# Patient Record
Sex: Female | Born: 1985 | Race: Black or African American | Hispanic: No | Marital: Married | Smoking: Never smoker
Health system: Southern US, Community
[De-identification: ages and names within clinical notes are randomized; demographics above are authoritative.]

## PROBLEM LIST (undated history)

## (undated) DIAGNOSIS — K589 Irritable bowel syndrome without diarrhea: Secondary | ICD-10-CM

## (undated) DIAGNOSIS — F419 Anxiety disorder, unspecified: Secondary | ICD-10-CM

## (undated) DIAGNOSIS — F410 Panic disorder [episodic paroxysmal anxiety] without agoraphobia: Secondary | ICD-10-CM

---
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
## 2007-04-13 ENCOUNTER — Emergency Department: Payer: Self-pay | Admitting: Emergency Medicine

## 2007-06-01 ENCOUNTER — Emergency Department: Payer: Self-pay | Admitting: Emergency Medicine

## 2008-06-04 ENCOUNTER — Emergency Department: Payer: Self-pay | Admitting: Emergency Medicine

## 2009-03-31 ENCOUNTER — Emergency Department: Payer: Self-pay | Admitting: Internal Medicine

## 2009-05-14 ENCOUNTER — Emergency Department: Payer: Self-pay | Admitting: Emergency Medicine

## 2009-07-15 ENCOUNTER — Inpatient Hospital Stay: Payer: Self-pay | Admitting: Internal Medicine

## 2010-01-03 ENCOUNTER — Emergency Department: Payer: Self-pay | Admitting: Emergency Medicine

## 2010-02-13 ENCOUNTER — Emergency Department: Payer: Self-pay | Admitting: Emergency Medicine

## 2010-06-19 ENCOUNTER — Emergency Department: Payer: Self-pay | Admitting: Emergency Medicine

## 2010-09-11 ENCOUNTER — Emergency Department: Payer: Self-pay | Admitting: Emergency Medicine

## 2011-04-22 ENCOUNTER — Emergency Department: Payer: Self-pay | Admitting: *Deleted

## 2011-08-25 ENCOUNTER — Emergency Department: Payer: Self-pay | Admitting: Emergency Medicine

## 2011-08-28 ENCOUNTER — Ambulatory Visit: Payer: Self-pay | Admitting: Family Medicine

## 2011-10-01 ENCOUNTER — Observation Stay: Payer: Self-pay | Admitting: Obstetrics and Gynecology

## 2011-10-01 LAB — URINALYSIS, COMPLETE
Bilirubin,UR: NEGATIVE
Blood: NEGATIVE
Glucose,UR: NEGATIVE mg/dL (ref 0–75)
Ph: 7 (ref 4.5–8.0)
Protein: NEGATIVE
Specific Gravity: 1.004 (ref 1.003–1.030)
WBC UR: 8 /HPF (ref 0–5)

## 2011-10-03 LAB — URINE CULTURE

## 2013-11-23 ENCOUNTER — Emergency Department: Payer: Self-pay | Admitting: Student

## 2013-11-23 LAB — URINALYSIS, COMPLETE
Bacteria: NONE SEEN
Bilirubin,UR: NEGATIVE
Blood: NEGATIVE
GLUCOSE, UR: NEGATIVE mg/dL (ref 0–75)
KETONE: NEGATIVE
NITRITE: NEGATIVE
Ph: 5 (ref 4.5–8.0)
Protein: NEGATIVE
RBC, UR: NONE SEEN /HPF (ref 0–5)
SPECIFIC GRAVITY: 1.024 (ref 1.003–1.030)
WBC UR: 3 /HPF (ref 0–5)

## 2013-11-23 LAB — CBC
HCT: 38.3 % (ref 35.0–47.0)
HGB: 12.3 g/dL (ref 12.0–16.0)
MCH: 28 pg (ref 26.0–34.0)
MCHC: 32.1 g/dL (ref 32.0–36.0)
MCV: 87 fL (ref 80–100)
Platelet: 291 10*3/uL (ref 150–440)
RBC: 4.4 10*6/uL (ref 3.80–5.20)
RDW: 13.1 % (ref 11.5–14.5)
WBC: 6.9 10*3/uL (ref 3.6–11.0)

## 2013-11-23 LAB — COMPREHENSIVE METABOLIC PANEL
ANION GAP: 8 (ref 7–16)
Albumin: 3.5 g/dL (ref 3.4–5.0)
Alkaline Phosphatase: 81 U/L
BILIRUBIN TOTAL: 0.4 mg/dL (ref 0.2–1.0)
BUN: 8 mg/dL (ref 7–18)
Calcium, Total: 8.4 mg/dL — ABNORMAL LOW (ref 8.5–10.1)
Chloride: 110 mmol/L — ABNORMAL HIGH (ref 98–107)
Co2: 25 mmol/L (ref 21–32)
Creatinine: 1.03 mg/dL (ref 0.60–1.30)
EGFR (Non-African Amer.): >60
Glucose: 87 mg/dL (ref 65–99)
Osmolality: 283 (ref 275–301)
Potassium: 3.9 mmol/L (ref 3.5–5.1)
SGOT(AST): 20 U/L (ref 15–37)
SGPT (ALT): 15 U/L
Sodium: 143 mmol/L (ref 136–145)
Total Protein: 7.7 g/dL (ref 6.4–8.2)

## 2013-11-23 LAB — LIPASE, BLOOD: LIPASE: 204 U/L (ref 73–393)

## 2015-07-24 ENCOUNTER — Emergency Department: Payer: Managed Care, Other (non HMO)

## 2015-07-24 ENCOUNTER — Emergency Department
Admission: EM | Admit: 2015-07-24 | Discharge: 2015-07-24 | Disposition: A | Discharge status: Home / Self Care | Payer: Managed Care, Other (non HMO) | Attending: Emergency Medicine | Admitting: Emergency Medicine

## 2015-07-24 ENCOUNTER — Encounter: Payer: Self-pay | Admitting: Emergency Medicine

## 2015-07-24 DIAGNOSIS — R0602 Shortness of breath: Secondary | ICD-10-CM | POA: Diagnosis present

## 2015-07-24 DIAGNOSIS — R0600 Dyspnea, unspecified: Secondary | ICD-10-CM

## 2015-07-24 DIAGNOSIS — F419 Anxiety disorder, unspecified: Secondary | ICD-10-CM | POA: Insufficient documentation

## 2015-07-24 HISTORY — DX: Panic disorder (episodic paroxysmal anxiety): F41.0

## 2015-07-24 HISTORY — DX: Anxiety disorder, unspecified: F41.9

## 2015-07-24 HISTORY — DX: Irritable bowel syndrome, unspecified: K58.9

## 2015-07-24 HISTORY — DX: Irritable bowel syndrome without diarrhea: K58.9

## 2015-07-24 LAB — BASIC METABOLIC PANEL
ANION GAP: 6 (ref 5–15)
BUN: 5 mg/dL — ABNORMAL LOW (ref 6–20)
CALCIUM: 8.9 mg/dL (ref 8.9–10.3)
CO2: 23 mmol/L (ref 22–32)
CREATININE: 0.82 mg/dL (ref 0.44–1.00)
Chloride: 109 mmol/L (ref 101–111)
Glucose, Bld: 107 mg/dL — ABNORMAL HIGH (ref 65–99)
Potassium: 3 mmol/L — ABNORMAL LOW (ref 3.5–5.1)
SODIUM: 138 mmol/L (ref 135–145)

## 2015-07-24 LAB — CBC WITH DIFFERENTIAL/PLATELET
BASOS ABS: 0 10*3/uL (ref 0–0.1)
BASOS PCT: 0 %
EOS ABS: 0.1 10*3/uL (ref 0–0.7)
Eosinophils Relative: 1 %
HCT: 41 % (ref 35.0–47.0)
Hemoglobin: 13.6 g/dL (ref 12.0–16.0)
Lymphocytes Relative: 41 %
Lymphs Abs: 3.9 10*3/uL — ABNORMAL HIGH (ref 1.0–3.6)
MCH: 28.3 pg (ref 26.0–34.0)
MCHC: 33.1 g/dL (ref 32.0–36.0)
MCV: 85.5 fL (ref 80.0–100.0)
MONO ABS: 0.8 10*3/uL (ref 0.2–0.9)
MONOS PCT: 8 %
NEUTROS ABS: 4.7 10*3/uL (ref 1.4–6.5)
Neutrophils Relative %: 50 %
PLATELETS: 296 10*3/uL (ref 150–440)
RBC: 4.8 MIL/uL (ref 3.80–5.20)
RDW: 13.9 % (ref 11.5–14.5)
WBC: 9.5 10*3/uL (ref 3.6–11.0)

## 2015-07-24 LAB — BRAIN NATRIURETIC PEPTIDE: B NATRIURETIC PEPTIDE 5: 10 pg/mL (ref 0.0–100.0)

## 2015-07-24 LAB — HCG, QUANTITATIVE, PREGNANCY

## 2015-07-24 LAB — TROPONIN I

## 2015-07-24 MED ORDER — IBUPROFEN 800 MG PO TABS: 800.0000 mg | ORAL_TABLET | Freq: Once | ORAL | Status: DC

## 2015-07-24 MED ORDER — KETOROLAC TROMETHAMINE 30 MG/ML IJ SOLN
30.0000 mg | Freq: Once | INTRAMUSCULAR | Status: AC
  Administered 2015-07-24: 30 mg via INTRAVENOUS

## 2015-07-24 MED ORDER — LORAZEPAM 1 MG PO TABS: 1.0000 mg | ORAL_TABLET | Freq: Two times a day (BID) | ORAL | Status: AC

## 2015-07-24 MED ORDER — KETOROLAC TROMETHAMINE 30 MG/ML IJ SOLN
INTRAMUSCULAR | Status: AC
  Administered 2015-07-24: 30 mg via INTRAVENOUS
  Filled 2015-07-24: qty 1

## 2015-07-24 NOTE — ED Notes (Signed)
Patient presents to the ED with intermittent episodes of palpitations, shortness of breath, extreme fatigue and anxiety since Friday.  Patient states, "I've been feeling really weird."  Patient's breath sounds are clear but slightly diminished.  Patient denies any history of asthma.  Patient breathing slightly fast in triage 24breats/min.  Patient denies fever, vomiting, or diarrhea.  Patient reports night sweats.

## 2015-07-24 NOTE — ED Notes (Signed)
Pt discharged to home.  Friend driving.  Discharge instructions reviewed.  Verbalized understanding.  No questions or concerns at this time.  Teach back verified.  Pt in NAD.  No items left in ED.   

## 2015-07-24 NOTE — ED Provider Notes (Signed)
[facility] Emergency Department Provider Note     Time seen: ----------------------------------------- 4:10 PM on 07/24/2015 -----------------------------------------    I have reviewed the triage vital signs and the nursing notes.   HISTORY  Chief Complaint Palpitations; Shortness of Breath; and Weakness    HPI Carrie Hebert is a 30 y.o. female who presents to ER for palpitations, shortness of breath and fatigue with anxiety since Friday. Patient states she's not been feeling herself, she denies any history of asthma, states she was seen by her primary care doctor for the shortness of breath and was told she may have a sinus infection was sent here for further evaluation. She denies fever, vomiting or diarrhea. She is on dip oh for birth control, has not had a menstrual cycle in some time. She does report some night sweats which are unusual for her   Past Medical History  Diagnosis Date  . IBS (irritable bowel syndrome)   . Anxiety   . Panic attacks     There are no active problems to display for this patient.   History reviewed. No pertinent past surgical history.  Allergies Percocet and Vicodin  Social History Social History  Substance Use Topics  . Smoking status: Never Smoker   . Smokeless tobacco: None  . Alcohol Use: Yes     Comment: couple of times a week    Review of Systems Constitutional: Negative for fever. Eyes: Negative for visual changes. ENT: Negative for sore throat. Cardiovascular: Negative for chest pain.Positive for palpitations Respiratory: Positive for shortness of breath Gastrointestinal: Negative for abdominal pain, vomiting and diarrhea. Genitourinary: Negative for dysuria. Musculoskeletal: Negative for back pain. Skin: Negative for rash. Neurological: Negative for headaches, positive for weakness  10-point ROS otherwise negative.  ____________________________________________   PHYSICAL  EXAM:  VITAL SIGNS: ED Triage Vitals  Enc Vitals Group     BP 07/24/15 1601 134/78 mmHg     Pulse Rate 07/24/15 1601 106     Resp 07/24/15 1601 22     Temp 07/24/15 1601 98.3 F (36.8 C)     Temp Source 07/24/15 1601 Oral     SpO2 07/24/15 1601 98 %     Weight 07/24/15 1601 200 lb (90.719 kg)     Height 07/24/15 1601  (1.702 m)     Head Cir --      Peak Flow --      Pain Score 07/24/15 1601 8     Pain Loc --      Pain Edu? --      Excl. in GC? --    Constitutional: Alert and oriented. Well appearing and in no distress. Eyes: Conjunctivae are normal. PERRL. Normal extraocular movements. ENT   Head: Normocephalic and atraumatic.   Nose: No congestion/rhinnorhea.   Mouth/Throat: Mucous membranes are moist.   Neck: No stridor. Cardiovascular: Normal rate, regular rhythm. No murmurs, rubs, or gallops. Respiratory: Normal respiratory effort without tachypnea nor retractions. Breath sounds are clear and equal bilaterally. No wheezes/rales/rhonchi. Gastrointestinal: Soft and nontender. Normal bowel sounds Musculoskeletal: Nontender with normal range of motion in all extremities. No lower extremity tenderness nor edema. Neurologic:  Normal speech and language. No gross focal neurologic deficits are appreciated.  Skin:  Skin is warm, dry and intact. No rash noted. Psychiatric: Mood and affect are normal. Speech and behavior are normal.  ____________________________________________  EKG: Interpreted by me. Sinus tachycardia with a rate of 101 bpm, normal PR interval, normal QRS, normal QT interval. Normal  axis.  ____________________________________________  ED COURSE:  Pertinent labs & imaging results that were available during my care of the patient were reviewed by me and considered in my medical decision making (see chart for details). Patient is in no acute distress, will check basic labs, pregnancy test and  reevaluate. ____________________________________________    LABS (pertinent positives/negatives)  Labs Reviewed  CBC WITH DIFFERENTIAL/PLATELET - Abnormal; Notable for the following:    Lymphs Abs 3.9 (*)    All other components within normal limits  BASIC METABOLIC PANEL - Abnormal; Notable for the following:    Potassium 3.0 (*)    Glucose, Bld 107 (*)    BUN 5 (*)    All other components within normal limits  BRAIN NATRIURETIC PEPTIDE  TROPONIN I  HCG, QUANTITATIVE, PREGNANCY    RADIOLOGY Images were viewed by me  Chest x-ray  IMPRESSION: No active cardiopulmonary disease. ____________________________________________  FINAL ASSESSMENT AND PLAN  Dyspnea, Anxiety  Plan: Patient with labs and imaging as dictated above. Patient most likely is presenting with dyspnea that is symptom from anxiety. I will start her on Ativan to take as needed. She is advised to restart her Zoloft at home as prescribed.   Emily FilbertWilliams, Jonathan E, MD   Emily FilbertJonathan E Williams, MD 07/24/15 [phone]

## 2015-07-24 NOTE — Discharge Instructions (Signed)
Panic Attacks °Panic attacks are sudden, short-lived surges of severe anxiety, fear, or discomfort. They may occur for no reason when you are relaxed, when you are anxious, or when you are sleeping. Panic attacks may occur for a number of reasons:  °· Healthy people occasionally have panic attacks in extreme, life-threatening situations, such as war or natural disasters. Normal anxiety is a protective mechanism of the body that helps us react to danger (fight or flight response). °· Panic attacks are often seen with anxiety disorders, such as panic disorder, social anxiety disorder, generalized anxiety disorder, and phobias. Anxiety disorders cause excessive or uncontrollable anxiety. They may interfere with your relationships or other life activities. °· Panic attacks are sometimes seen with other mental illnesses, such as depression and posttraumatic stress disorder. °· Certain medical conditions, prescription medicines, and drugs of abuse can cause panic attacks. °SYMPTOMS  °Panic attacks start suddenly, peak within 20 minutes, and are accompanied by four or more of the following symptoms: °· Pounding heart or fast heart rate (palpitations). °· Sweating. °· Trembling or shaking. °· Shortness of breath or feeling smothered. °· Feeling choked. °· Chest pain or discomfort. °· Nausea or strange feeling in your stomach. °· Dizziness, light-headedness, or feeling like you will faint. °· Chills or hot flushes. °· Numbness or tingling in your lips or hands and feet. °· Feeling that things are not real or feeling that you are not yourself. °· Fear of losing control or going crazy. °· Fear of dying. °Some of these symptoms can mimic serious medical conditions. For example, you may think you are having a heart attack. Although panic attacks can be very scary, they are not life threatening. °DIAGNOSIS  °Panic attacks are diagnosed through an assessment by your health care provider. Your health care provider will ask  questions about your symptoms, such as where and when they occurred. Your health care provider will also ask about your medical history and use of alcohol and drugs, including prescription medicines. Your health care provider may order blood tests or other studies to rule out a serious medical condition. Your health care provider may refer you to a mental health professional for further evaluation. °TREATMENT  °· Most healthy people who have one or two panic attacks in an extreme, life-threatening situation will not require treatment. °· The treatment for panic attacks associated with anxiety disorders or other mental illness typically involves counseling with a mental health professional, medicine, or a combination of both. Your health care provider will help determine what treatment is best for you. °· Panic attacks due to physical illness usually go away with treatment of the illness. If prescription medicine is causing panic attacks, talk with your health care provider about stopping the medicine, decreasing the dose, or substituting another medicine. °· Panic attacks due to alcohol or drug abuse go away with abstinence. Some adults need professional help in order to stop drinking or using drugs. °HOME CARE INSTRUCTIONS  °· Take all medicines as directed by your health care provider.   °· Schedule and attend follow-up visits as directed by your health care provider. It is important to keep all your appointments. °SEEK MEDICAL CARE IF: °· You are not able to take your medicines as prescribed. °· Your symptoms do not improve or get worse. °SEEK IMMEDIATE MEDICAL CARE IF:  °· You experience panic attack symptoms that are different than your usual symptoms. °· You have serious thoughts about hurting yourself or others. °· You are taking medicine for panic attacks and   have a serious side effect. °MAKE SURE YOU: °· Understand these instructions. °· Will watch your condition. °· Will get help right away if you are not  doing well or get worse. °  °This information is not intended to replace advice given to you by your health care provider. Make sure you discuss any questions you have with your health care provider. °  °Document Released: 03/31/2005 Document Revised: 04/05/2013 Document Reviewed: 11/12/2012 °Elsevier Interactive Patient Education ©2016 [employer] ° °Shortness of Breath °Shortness of breath means you have trouble breathing. It could also mean that you have a medical problem. You should get immediate medical care for shortness of breath. °CAUSES  °· Not enough oxygen in the air such as with high altitudes or a smoke-filled room. °· Certain lung diseases, infections, or problems. °· Heart disease or conditions, such as angina or heart failure. °· Low red blood cells (anemia). °· Poor physical fitness, which can cause shortness of breath when you exercise. °· Chest or back injuries or stiffness. °· Being overweight. °· Smoking. °· Anxiety, which can make you feel like you are not getting enough air. °DIAGNOSIS  °Serious medical problems can often be found during your physical exam. Tests may also be done to determine why you are having shortness of breath. Tests may include: °· Chest X-rays. °· Lung function tests. °· Blood tests. °· An electrocardiogram (ECG). °· An ambulatory electrocardiogram. An ambulatory ECG records your heartbeat patterns over a 24-hour period. °· Exercise testing. °· A transthoracic echocardiogram (TTE). During echocardiography, sound waves are used to evaluate how blood flows through your heart. °· A transesophageal echocardiogram (TEE). °· Imaging scans. °Your health care provider may not be able to find a cause for your shortness of breath after your exam. In this case, it is important to have a follow-up exam with your health care provider as directed.  °TREATMENT  °Treatment for shortness of breath depends on the cause of your symptoms and can vary greatly. °HOME CARE INSTRUCTIONS   °· Do not smoke. Smoking is a common cause of shortness of breath. If you smoke, ask for help to quit. °· Avoid being around chemicals or things that may bother your breathing, such as paint fumes and dust. °· Rest as needed. Slowly resume your usual activities. °· If medicines were prescribed, take them as directed for the full length of time directed. This includes oxygen and any inhaled medicines. °· Keep all follow-up appointments as directed by your health care provider. °SEEK MEDICAL CARE IF:  °· Your condition does not improve in the time expected. °· You have a hard time doing your normal activities even with rest. °· You have any new symptoms. °SEEK IMMEDIATE MEDICAL CARE IF:  °· Your shortness of breath gets worse. °· You feel light-headed, faint, or develop a cough not controlled with medicines. °· You start coughing up blood. °· You have pain with breathing. °· You have chest pain or pain in your arms, shoulders, or abdomen. °· You have a fever. °· You are unable to walk up stairs or exercise the way you normally do. °MAKE SURE YOU: °· Understand these instructions. °· Will watch your condition. °· Will get help right away if you are not doing well or get worse. °  °This information is not intended to replace advice given to you by your health care provider. Make sure you discuss any questions you have with your health care provider. °  °Document Released: 12/24/2000 Document Revised: 04/05/2013   Document Reviewed: 06/16/2011 °Elsevier Interactive Patient Education ©2016 [employer] Inc. ° °

## 2018-01-20 ENCOUNTER — Encounter (HOSPITAL_COMMUNITY): Payer: Self-pay | Admitting: Emergency Medicine

## 2018-01-20 ENCOUNTER — Emergency Department (HOSPITAL_COMMUNITY)
Admission: EM | Admit: 2018-01-20 | Discharge: 2018-01-20 | Disposition: A | Discharge status: Home / Self Care | Payer: 59 | Attending: Emergency Medicine | Admitting: Emergency Medicine

## 2018-01-20 ENCOUNTER — Other Ambulatory Visit: Payer: Self-pay

## 2018-01-20 ENCOUNTER — Emergency Department (HOSPITAL_COMMUNITY): Payer: 59

## 2018-01-20 DIAGNOSIS — R002 Palpitations: Secondary | ICD-10-CM | POA: Insufficient documentation

## 2018-01-20 DIAGNOSIS — R0789 Other chest pain: Secondary | ICD-10-CM

## 2018-01-20 LAB — I-STAT CHEM 8, ED
BUN: 5 mg/dL — ABNORMAL LOW (ref 6–20)
Calcium, Ion: 1.14 mmol/L — ABNORMAL LOW (ref 1.15–1.40)
Chloride: 106 mmol/L (ref 98–111)
Creatinine, Ser: 0.8 mg/dL (ref 0.44–1.00)
Glucose, Bld: 181 mg/dL — ABNORMAL HIGH (ref 70–99)
HEMATOCRIT: 42 % (ref 36.0–46.0)
HEMOGLOBIN: 14.3 g/dL (ref 12.0–15.0)
POTASSIUM: 3.4 mmol/L — AB (ref 3.5–5.1)
SODIUM: 138 mmol/L (ref 135–145)
TCO2: 22 mmol/L (ref 22–32)

## 2018-01-20 LAB — I-STAT BETA HCG BLOOD, ED (MC, WL, AP ONLY)

## 2018-01-20 MED ORDER — ONDANSETRON 4 MG PO TBDP
4.0000 mg | ORAL_TABLET | Freq: Once | ORAL | Status: AC
  Administered 2018-01-20: 4 mg via ORAL
  Filled 2018-01-20: qty 1

## 2018-01-20 MED ORDER — GI COCKTAIL ~~LOC~~
30.0000 mL | Freq: Once | ORAL | Status: AC
  Administered 2018-01-20: 30 mL via ORAL
  Filled 2018-01-20: qty 30

## 2018-01-20 NOTE — ED Notes (Signed)
ED Provider at bedside. 

## 2018-01-20 NOTE — ED Triage Notes (Signed)
Per EMS, pt coming from work with complaints of a HR in the 170's. Pt states that around 1400 she started to get real diaphoretic and had a near syncopal episode. Pt stated that her apple watch read her HR in the 170's. Pt stated this has been going on since July and she just finished wearing a Holter monitor. Pt denies CP or SOB.

## 2018-01-20 NOTE — ED Provider Notes (Signed)
[facility] EMERGENCY DEPARTMENT Provider Note   CSN: [id number] Arrival date & time: 01/20/18  1501     History   Chief Complaint Chief Complaint  Patient presents with  . Tachycardia    HPI Carrie Hebert is a 32 y.o. female.  32 yo F with a cc of palpitations.  170's on fit watch.  Hands tingling, felt "numb".  Lasted for about 1/2 hour.  HR improved with EMS.  Episodes similar in past.  Holter in past.  Denies other prior cardiac history.  Denies fevers, cough, so.  Had some mild midsternal chest pain. No fam hx of sudden cardiac death.   The patient describes that she had some tingling around her mouth and then felt tingling down both of her arms and then felt that her hands went into claws and then she collapsed onto the ground.  She denies any injury from the fall.  Scribes that her chest pain is sharp worse with wearing a bra, moving or twisting.  Worse with deep breathing.  She denies hemoptysis denies cough or fever denies recent surgery or hospitalization.  Denies estrogen use.  Denies history of PE or DVT.  Denies history of cancer.  The history is provided by the patient.  Illness  This is a recurrent problem. The current episode started less than 1 hour ago. The problem occurs constantly. The problem has been resolved. Pertinent negatives include no chest pain, no abdominal pain, no headaches and no shortness of breath. Nothing aggravates the symptoms. Nothing relieves the symptoms. She has tried nothing for the symptoms. The treatment provided no relief.    Past Medical History:  Diagnosis Date  . Anxiety   . IBS (irritable bowel syndrome)   . Panic attacks     There are no active problems to display for this patient.   History reviewed. No pertinent surgical history.   OB History   None      Home Medications    Prior to Admission medications   Medication Sig Start Date End Date Taking? Authorizing Provider  ibuprofen  (ADVIL,MOTRIN) 800 MG tablet Take 800 mg by mouth as needed for mild pain.    Yes [provider]    Family History History reviewed. No pertinent family history.  Social History Social History   Tobacco Use  . Smoking status: Never Smoker  Substance Use Topics  . Alcohol use: Yes    Comment: couple of times a week  . Drug use: Not on file     Allergies   Nickel; Percocet [oxycodone-acetaminophen]; and Vicodin [hydrocodone-acetaminophen]   Review of Systems Review of Systems  Constitutional: Negative for chills and fever.  HENT: Negative for congestion and rhinorrhea.   Eyes: Negative for redness and visual disturbance.  Respiratory: Negative for shortness of breath and wheezing.   Cardiovascular: Negative for chest pain and palpitations.  Gastrointestinal: Negative for abdominal pain, nausea and vomiting.  Genitourinary: Negative for dysuria and urgency.  Musculoskeletal: Negative for arthralgias and myalgias.  Skin: Negative for pallor and wound.  Neurological: Negative for dizziness and headaches.     Physical Exam Updated Vital Signs BP 113/69   Pulse 82   Resp (!) 22   Ht 5\' 7"  (1.702 m)   Wt 88 kg   SpO2 98%   BMI 30.38 kg/m   Physical Exam  Constitutional: She is oriented to person, place, and time. She appears well-developed and well-nourished. No distress.  HENT:  Head: Normocephalic and atraumatic.  Eyes: Pupils are equal, round, and reactive to light. EOM are normal.  Neck: Normal range of motion. Neck supple.  Cardiovascular: Normal rate and regular rhythm. Exam reveals no gallop and no friction rub.  No murmur heard. Pulmonary/Chest: Effort normal. She has no wheezes. She has no rales. She exhibits tenderness ( Palpation of the left sternal border about ribs 3 and 4 reproduce the patient's pain).  Abdominal: Soft. She exhibits no distension. There is no tenderness.  Musculoskeletal: She exhibits no edema or tenderness.  Neurological:  She is alert and oriented to person, place, and time.  Skin: Skin is warm and dry. She is not diaphoretic.  Psychiatric: She has a normal mood and affect. Her behavior is normal.  Nursing note and vitals reviewed.    ED Treatments / Results  Labs (all labs ordered are listed, but only abnormal results are displayed) Labs Reviewed  I-STAT CHEM 8, ED - Abnormal; Notable for the following components:      Result Value   Potassium 3.4 (*)    BUN 5 (*)    Glucose, Bld 181 (*)    Calcium, Ion 1.14 (*)    All other components within normal limits  I-STAT BETA HCG BLOOD, ED ([facility], [facility], [facility] ONLY)    EKG EKG Interpretation  Date/Time:  Wednesday January 20 2018 16:01:15 EDT Ventricular Rate:  95 PR Interval:    QRS Duration: 86 QT Interval:  439 QTC Calculation: 552 R Axis:   70 Text Interpretation:  Sinus rhythm Borderline T wave abnormalities Prolonged QT interval No significant change since last tracing Confirmed by Melene Plan [phone]) on 01/20/2018 4:41:10 PM   Radiology Dg Chest 2 View  Result Date: 01/20/2018 CLINICAL DATA:  Chest pain, shortness of breath. EXAM: CHEST - 2 VIEW COMPARISON:  Radiographs of July 24, 2015. FINDINGS: The heart size and mediastinal contours are within normal limits. Both lungs are clear. No pneumothorax or pleural effusion is noted. The visualized skeletal structures are unremarkable. IMPRESSION: No active cardiopulmonary disease. Electronically Signed   By: Lupita Raider, M.D.   On: 01/20/2018 16:49    Procedures Procedures (including critical care time)  Medications Ordered in ED Medications  ondansetron (ZOFRAN-ODT) disintegrating tablet 4 mg (4 mg Oral Given 01/20/18 1552)  gi cocktail (Maalox,Lidocaine,Donnatal) (30 mLs Oral Given 01/20/18 1552)     Initial Impression / Assessment and Plan / ED Course  I have reviewed the triage vital signs and the nursing notes.  Pertinent labs & imaging results that were available during my care of the  patient were reviewed by me and considered in my medical decision making (see chart for details).     32 yo F with a cc of palpitations.  Now resolved.  Patient had a history of this off and on.  Clinically this sounds like the patient had a panic attack.  She is having chest pain and this been going on for the past week or so.  Reproduced on palpation.  Most likely this is musculoskeletal.  Feel this is unlikely to be a PE and do not feel that further work-up is needed.  Unlikely as well to be ACS.  Will obtain an EKG and chest x-ray.  Workup here is unremarkable.  Patient has no significant electrolyte abnormality.  Her potassium is mildly low at 3.4.  She is not pregnant.  Chest x-ray is negative.  She has had no recurrent episodes while in the ED.  We will have her follow-up with her  cardiologist.  12:03 AM:  I have discussed the diagnosis/risks/treatment options with the patient and family and believe the pt to be eligible for discharge home to follow-up with Cards. We also discussed returning to the ED immediately if new or worsening sx occur. We discussed the sx which are most concerning (e.g., recurrent event, sob, chest pain) that necessitate immediate return. Medications administered to the patient during their visit and any new prescriptions provided to the patient are listed below.  Medications given during this visit Medications  ondansetron (ZOFRAN-ODT) disintegrating tablet 4 mg (4 mg Oral Given 01/20/18 1552)  gi cocktail (Maalox,Lidocaine,Donnatal) (30 mLs Oral Given 01/20/18 1552)     The patient appears reasonably screen and/or stabilized for discharge and I doubt any other medical condition or other [facility] requiring further screening, evaluation, or treatment in the ED at this time prior to discharge.    Final Clinical Impressions(s) / ED Diagnoses   Final diagnoses:  Palpitations  Chest wall pain    ED Discharge Orders    None       Melene Plan, DO 01/21/18 0003

## 2018-01-20 NOTE — Discharge Instructions (Signed)
Take 3 over the counter ibuprofen tablets 3 times a day or 2 over-the-counter naproxen tablets twice a day for pain. °Also take tylenol 1000mg(2 extra strength) four times a day.  ° ° °

## 2018-02-19 DIAGNOSIS — F419 Anxiety disorder, unspecified: Secondary | ICD-10-CM | POA: Insufficient documentation

## 2018-02-19 DIAGNOSIS — I4729 Other ventricular tachycardia: Secondary | ICD-10-CM | POA: Insufficient documentation

## 2018-04-27 DIAGNOSIS — G90A Postural orthostatic tachycardia syndrome (POTS): Secondary | ICD-10-CM | POA: Insufficient documentation

## 2019-03-28 ENCOUNTER — Other Ambulatory Visit: Payer: Self-pay

## 2019-03-28 DIAGNOSIS — Z20822 Contact with and (suspected) exposure to covid-19: Secondary | ICD-10-CM

## 2019-03-29 ENCOUNTER — Telehealth: Payer: Self-pay

## 2019-03-29 LAB — NOVEL CORONAVIRUS, NAA: SARS-CoV-2, NAA: NOT DETECTED

## 2020-02-22 DIAGNOSIS — R0789 Other chest pain: Secondary | ICD-10-CM | POA: Insufficient documentation

## 2020-07-25 ENCOUNTER — Other Ambulatory Visit: Payer: Self-pay

## 2020-07-25 ENCOUNTER — Observation Stay
Admission: EM | Admit: 2020-07-25 | Discharge: 2020-07-26 | Disposition: A | Discharge status: Home / Self Care | Payer: 59 | Attending: Emergency Medicine | Admitting: Emergency Medicine

## 2020-07-25 ENCOUNTER — Encounter: Payer: Self-pay | Admitting: Emergency Medicine

## 2020-07-25 ENCOUNTER — Emergency Department: Payer: 59

## 2020-07-25 DIAGNOSIS — M272 Inflammatory conditions of jaws: Secondary | ICD-10-CM | POA: Diagnosis not present

## 2020-07-25 DIAGNOSIS — Z20822 Contact with and (suspected) exposure to covid-19: Secondary | ICD-10-CM | POA: Insufficient documentation

## 2020-07-25 DIAGNOSIS — J029 Acute pharyngitis, unspecified: Secondary | ICD-10-CM | POA: Insufficient documentation

## 2020-07-25 DIAGNOSIS — K122 Cellulitis and abscess of mouth: Secondary | ICD-10-CM | POA: Diagnosis not present

## 2020-07-25 DIAGNOSIS — R6884 Jaw pain: Secondary | ICD-10-CM | POA: Diagnosis present

## 2020-07-25 LAB — COMPREHENSIVE METABOLIC PANEL
ALT: 7 U/L (ref 0–44)
AST: 26 U/L (ref 15–41)
Albumin: 3.7 g/dL (ref 3.5–5.0)
Alkaline Phosphatase: 109 U/L (ref 38–126)
Anion gap: 8 (ref 5–15)
BUN: 8 mg/dL (ref 6–20)
CO2: 24 mmol/L (ref 22–32)
Calcium: 8.8 mg/dL — ABNORMAL LOW (ref 8.9–10.3)
Chloride: 104 mmol/L (ref 98–111)
Creatinine, Ser: 0.87 mg/dL (ref 0.44–1.00)
GFR, Estimated: >60 mL/min (ref >60)
Glucose, Bld: 91 mg/dL (ref 70–99)
Potassium: 4.4 mmol/L (ref 3.5–5.1)
Sodium: 136 mmol/L (ref 135–145)
Total Bilirubin: 1.8 mg/dL — ABNORMAL HIGH (ref 0.3–1.2)
Total Protein: 7.6 g/dL (ref 6.5–8.1)

## 2020-07-25 LAB — RESP PANEL BY RT-PCR (FLU A&B, COVID) ARPGX2
Influenza A by PCR: NEGATIVE
Influenza B by PCR: NEGATIVE
SARS Coronavirus 2 by RT PCR: NEGATIVE

## 2020-07-25 LAB — CBC WITH DIFFERENTIAL/PLATELET
Abs Immature Granulocytes: 0.06 10*3/uL (ref 0.00–0.07)
Basophils Absolute: 0 10*3/uL (ref 0.0–0.1)
Basophils Relative: 0 %
Eosinophils Absolute: 0 10*3/uL (ref 0.0–0.5)
Eosinophils Relative: 0 %
HCT: 39.9 % (ref 36.0–46.0)
Hemoglobin: 13.1 g/dL (ref 12.0–15.0)
Immature Granulocytes: 1 %
Lymphocytes Relative: 16 %
Lymphs Abs: 2 10*3/uL (ref 0.7–4.0)
MCH: 28.5 pg (ref 26.0–34.0)
MCHC: 32.8 g/dL (ref 30.0–36.0)
MCV: 86.9 fL (ref 80.0–100.0)
Monocytes Absolute: 1 10*3/uL (ref 0.1–1.0)
Monocytes Relative: 8 %
Neutro Abs: 9.3 10*3/uL — ABNORMAL HIGH (ref 1.7–7.7)
Neutrophils Relative %: 75 %
Platelets: 339 10*3/uL (ref 150–400)
RBC: 4.59 MIL/uL (ref 3.87–5.11)
RDW: 12.8 % (ref 11.5–15.5)
WBC: 12.4 10*3/uL — ABNORMAL HIGH (ref 4.0–10.5)
nRBC: 0 % (ref 0.0–0.2)

## 2020-07-25 LAB — CBC
HCT: 38.9 % (ref 36.0–46.0)
Hemoglobin: 12.8 g/dL (ref 12.0–15.0)
MCH: 28.3 pg (ref 26.0–34.0)
MCHC: 32.9 g/dL (ref 30.0–36.0)
MCV: 85.9 fL (ref 80.0–100.0)
Platelets: 342 10*3/uL (ref 150–400)
RBC: 4.53 MIL/uL (ref 3.87–5.11)
RDW: 12.7 % (ref 11.5–15.5)
WBC: 13.2 10*3/uL — ABNORMAL HIGH (ref 4.0–10.5)
nRBC: 0 % (ref 0.0–0.2)

## 2020-07-25 LAB — HIV ANTIBODY (ROUTINE TESTING W REFLEX): HIV Screen 4th Generation wRfx: NONREACTIVE

## 2020-07-25 LAB — MONONUCLEOSIS SCREEN: Mono Screen: NEGATIVE

## 2020-07-25 LAB — CREATININE, SERUM
Creatinine, Ser: 0.67 mg/dL (ref 0.44–1.00)
GFR, Estimated: >60 mL/min (ref >60)

## 2020-07-25 LAB — GROUP A STREP BY PCR: Group A Strep by PCR: NOT DETECTED

## 2020-07-25 MED ORDER — ACETAMINOPHEN 650 MG RE SUPP: 650.0000 mg | Freq: Four times a day (QID) | RECTAL | Status: DC | PRN

## 2020-07-25 MED ORDER — ONDANSETRON HCL 4 MG PO TABS: 4.0000 mg | ORAL_TABLET | Freq: Four times a day (QID) | ORAL | Status: DC | PRN

## 2020-07-25 MED ORDER — KETOROLAC TROMETHAMINE 30 MG/ML IJ SOLN
30.0000 mg | Freq: Four times a day (QID) | INTRAMUSCULAR | Status: DC
  Administered 2020-07-26 (×3): 30 mg via INTRAVENOUS
  Filled 2020-07-25 (×3): qty 1

## 2020-07-25 MED ORDER — ENOXAPARIN SODIUM 40 MG/0.4ML ~~LOC~~ SOLN
40.0000 mg | SUBCUTANEOUS | Status: DC
  Filled 2020-07-25: qty 0.4

## 2020-07-25 MED ORDER — SODIUM CHLORIDE 0.9 % IV SOLN
3.0000 g | Freq: Four times a day (QID) | INTRAVENOUS | Status: DC
  Administered 2020-07-25 – 2020-07-26 (×4): 3 g via INTRAVENOUS
  Filled 2020-07-25 (×4): qty 8
  Filled 2020-07-25: qty 3
  Filled 2020-07-25 (×3): qty 8

## 2020-07-25 MED ORDER — ONDANSETRON HCL 4 MG/2ML IJ SOLN
4.0000 mg | Freq: Once | INTRAMUSCULAR | Status: AC
  Administered 2020-07-25: 4 mg via INTRAVENOUS
  Filled 2020-07-25: qty 2

## 2020-07-25 MED ORDER — DEXAMETHASONE SODIUM PHOSPHATE 10 MG/ML IJ SOLN
10.0000 mg | Freq: Once | INTRAMUSCULAR | Status: AC
  Administered 2020-07-25: 10 mg via INTRAVENOUS
  Filled 2020-07-25: qty 1

## 2020-07-25 MED ORDER — SODIUM CHLORIDE 0.9 % IV SOLN
3.0000 g | Freq: Once | INTRAVENOUS | Status: AC
  Administered 2020-07-25: 3 g via INTRAVENOUS
  Filled 2020-07-25: qty 8

## 2020-07-25 MED ORDER — ONDANSETRON HCL 4 MG/2ML IJ SOLN
4.0000 mg | Freq: Four times a day (QID) | INTRAMUSCULAR | Status: DC | PRN
Start: 2020-07-25 — End: 2020-07-26

## 2020-07-25 MED ORDER — ACETAMINOPHEN 325 MG PO TABS
650.0000 mg | ORAL_TABLET | Freq: Four times a day (QID) | ORAL | Status: DC | PRN
  Administered 2020-07-25 – 2020-07-26 (×2): 650 mg via ORAL
  Filled 2020-07-25 (×2): qty 2

## 2020-07-25 MED ORDER — SODIUM CHLORIDE 0.9 % IV BOLUS
1000.0000 mL | Freq: Once | INTRAVENOUS | Status: AC
  Administered 2020-07-25: 1000 mL via INTRAVENOUS

## 2020-07-25 MED ORDER — LACTATED RINGERS IV SOLN: INTRAVENOUS | Status: DC

## 2020-07-25 MED ORDER — IOHEXOL 300 MG/ML  SOLN
75.0000 mL | Freq: Once | INTRAMUSCULAR | Status: AC | PRN
  Administered 2020-07-25: 75 mL via INTRAVENOUS
  Filled 2020-07-25: qty 75

## 2020-07-25 MED ORDER — KETOROLAC TROMETHAMINE 30 MG/ML IJ SOLN: 30.0000 mg | Freq: Four times a day (QID) | INTRAMUSCULAR | Status: DC

## 2020-07-25 MED ORDER — KETOROLAC TROMETHAMINE 30 MG/ML IJ SOLN
30.0000 mg | Freq: Four times a day (QID) | INTRAMUSCULAR | Status: DC | PRN
  Administered 2020-07-25: 30 mg via INTRAVENOUS
  Filled 2020-07-25: qty 1

## 2020-07-25 NOTE — ED Provider Notes (Signed)
[facility] Emergency Department Provider Note  ____________________________________________   Event Date/Time   First MD Initiated Contact with Patient 07/25/20 1019     (approximate)  I have reviewed the triage vital signs and the nursing notes.   HISTORY  Chief Complaint Dental Pain and Sore Throat    HPI Carrie Hebert is a 35 y.o. female presents emergency department complaining of left-sided jaw pain with swelling that extends down into the left side of the neck and is starting to go to the front, patient states she also has a sore throat and cannot open her mouth.  Difficult for her to swallow her saliva.  She was seen at the dentist yesterday and they told her she had a tooth infection and placed her on antibiotic.  States she is gotten worse instead of better.  She denies fever    Past Medical History:  Diagnosis Date  . Anxiety   . IBS (irritable bowel syndrome)   . Panic attacks     There are no problems to display for this patient.   History reviewed. No pertinent surgical history.  Prior to Admission medications   Medication Sig Start Date End Date Taking? Authorizing Provider  ibuprofen (ADVIL,MOTRIN) 800 MG tablet Take 800 mg by mouth as needed for mild pain.     [provider]    Allergies Nickel, Percocet [oxycodone-acetaminophen], and Vicodin [hydrocodone-acetaminophen]  History reviewed. No pertinent family history.  Social History Social History   Tobacco Use  . Smoking status: Never Smoker  . Smokeless tobacco: Never Used  Substance Use Topics  . Alcohol use: Yes    Comment: couple of times a week    Review of Systems  Constitutional: No fever/chills Eyes: No visual changes. ENT: Positive sore throat. Respiratory: Denies cough Cardiovascular: Denies chest pain Gastrointestinal: Denies abdominal pain Genitourinary: Negative for dysuria. Musculoskeletal: Negative for back pain. Skin:  Negative for rash. Psychiatric: no mood changes,     ____________________________________________   PHYSICAL EXAM:  VITAL SIGNS: ED Triage Vitals  Enc Vitals Group     BP 07/25/20 1012 118/83     Pulse Rate 07/25/20 1012 85     Resp 07/25/20 1012 18     Temp 07/25/20 1012 99 F (37.2 C)     Temp Source 07/25/20 1012 Oral     SpO2 07/25/20 1012 97 %     Weight 07/25/20 1013 198 lb (89.8 kg)     Height 07/25/20 1013 5\' 7"  (1.702 m)     Head Circumference --      Peak Flow --      Pain Score 07/25/20 1013 10     Pain Loc --      Pain Edu? --      Excl. in GC? --     Constitutional: Alert and oriented. Well appearing and in no acute distress. Eyes: Conjunctivae are normal.  Head: Atraumatic. Nose: No congestion/rhinnorhea. Mouth/Throat: Mucous membranes are moist.  Trismus noted, unable to see the posterior pharynx due to the amount of trismus, swelling at the left jaw that extends into the left side of the neck and under the mandible Neck:  supple no lymphadenopathy noted Cardiovascular: Normal rate, regular rhythm. Heart sounds are normal Respiratory: Normal respiratory effort.  No retractions, lungs c t a  GU: deferred Musculoskeletal: FROM all extremities, warm and well perfused Neurologic:  Normal speech and language.  Skin:  Skin is warm, dry and intact. No rash noted. Psychiatric: Mood  and affect are normal. Speech and behavior are normal.  ____________________________________________   LABS (all labs ordered are listed, but only abnormal results are displayed)  Labs Reviewed  CBC WITH DIFFERENTIAL/PLATELET - Abnormal; Notable for the following components:      Result Value   WBC 12.4 (*)    Neutro Abs 9.3 (*)    All other components within normal limits  COMPREHENSIVE METABOLIC PANEL - Abnormal; Notable for the following components:   Calcium 8.8 (*)    Total Bilirubin 1.8 (*)    All other components within normal limits  GROUP A STREP BY PCR  RESP  PANEL BY RT-PCR (FLU A&B, COVID) [id number]  MONONUCLEOSIS SCREEN   ____________________________________________   ____________________________________________  RADIOLOGY  CT soft tissue of the neck  ____________________________________________   PROCEDURES  Procedure(s) performed: No  Procedures    ____________________________________________   INITIAL IMPRESSION / ASSESSMENT AND PLAN / ED COURSE  Pertinent labs & imaging results that were available during my care of the patient were reviewed by me and considered in my medical decision making (see chart for details).   Patient is a 35 year old female presents with swelling of the face and neck.  See HPI.  Physical exam shows patient per stable.  DDx: Dental abscess, peritonsillar abscess, Ludwick's angina  CBC, comprehensive metabolic panel, mono, and strep test ordered Patient is to be given normal saline 1 L IV along with Decadron 10 mg IV   Labs are reassuring, strep, mono, CBC, and comprehensive metabolic panel are normal  CT does show large amount of inflammation and swelling due to cellulitis.  Radiologist states he feels this is from her tooth.  This was also reviewed by me.  Discussed with Dr. Willeen Cass.  Dr. Willeen Cass states that since that dental cellulitis he would not round on her but thinks the hospitalist could admit  Discussed with hospitalist.  Patient will be admitted for IV antibiotics steroids etc., patient was admitted in stable condition.  Carrie Hebert was evaluated in Emergency Department on 07/25/2020 for the symptoms described in the history of present illness. She was evaluated in the context of the global COVID-19 pandemic, which necessitated consideration that the patient might be at risk for infection with the SARS-CoV-2 virus that causes COVID-19. Institutional protocols and algorithms that pertain to the evaluation of patients at risk for COVID-19 are in a state of rapid change based on  information released by regulatory bodies including the CDC and federal and state organizations. These policies and algorithms were followed during the patient's care in the ED.    As part of my medical decision making, I reviewed the following data within the electronic MEDICAL RECORD NUMBER Nursing notes reviewed and incorporated, Labs reviewed , Old chart reviewed, Radiograph reviewed , Discussed with admitting physician , A consult was requested and obtained from this/these consultant(s) ENT, Notes from prior ED visits and [place] Controlled Substance Database  ____________________________________________   FINAL CLINICAL IMPRESSION(S) / ED DIAGNOSES  Final diagnoses:  Odontogenic infection of jaw      NEW MEDICATIONS STARTED DURING THIS VISIT:  New Prescriptions   No medications on file     Note:  This document was prepared using Dragon voice recognition software and may include unintentional dictation errors.    Faythe Ghee, PA-C 07/25/20 1437    Jene Every, MD 07/25/20 [phone]

## 2020-07-25 NOTE — Progress Notes (Signed)
Pharmacy Antibiotic Note  Carrie Hebert is a 35 y.o. female admitted on 07/25/2020. Pharmacy has been consulted for Unasyn dosing for dental infection.  Plan: Unasyn 3 g IV q6h  Height: 5\' 7"  (170.2 cm) Weight: 89.8 kg (198 lb) IBW/kg (Calculated) : 61.6  Temp (24hrs), Avg:99 F (37.2 C), Min:99 F (37.2 C), Max:99 F (37.2 C)  Recent Labs  Lab 07/25/20 1135  WBC 12.4*  CREATININE 0.87    Estimated Creatinine Clearance: 106.8 mL/min (by C-G formula based on SCr of 0.87 mg/dL).    Allergies  Allergen Reactions  . Nickel Itching  . Percocet [Oxycodone-Acetaminophen] Itching  . Vicodin [Hydrocodone-Acetaminophen] Hives    Antimicrobials this admission: Unasyn 4/13 >>   Thank you for allowing pharmacy to be a part of this patient's care.  5/13, PharmD 07/25/2020 3:05 PM

## 2020-07-25 NOTE — ED Triage Notes (Signed)
Pt comes into the ED via POV c/o dental pain on the left lower side.  Pt also c/o sore throat and swelling.  Pt able to speak but states it is difficult for her to swallow saliva at this time.  Pt placed on antibiotics yesterday but she has never had a reaction to them in the past.  Pt also states the swelling in her throat also started prior to the antibiotic use.

## 2020-07-25 NOTE — ED Notes (Signed)
Provider at bedside

## 2020-07-25 NOTE — Plan of Care (Signed)

## 2020-07-25 NOTE — Progress Notes (Signed)
Pt admitted to room 157 and oriented to all equipment. VSS and listed below. All questions and concerns answered at this time. Pt has ordered a meal and is resting comfortably.  07/25/20 1606  Vitals  Temp 98.3 F (36.8 C)  BP 124/81  MAP (mmHg) 94  BP Location Left Arm  BP Method Automatic  Patient Position (if appropriate) Sitting  Pulse Rate 84  Resp 17  MEWS COLOR  MEWS Score Color Green  Oxygen Therapy  SpO2 99 %  O2 Device Room [facility]

## 2020-07-25 NOTE — H&P (Signed)
History and Physical    Carrie Hebert:[id number] DOB: July 25, 1985 DOA: 07/25/2020  PCP: [facility]   Patient coming from: Home  I have personally briefly reviewed patient's old medical records in [facility]  Chief Complaint: Sore throat and jaw pain.  HPI: Carrie Hebert is a 35 y.o. female with no significant medical history came to ED with complaint of left-sided jaw pain and swelling that extends down into the neck.  Per patient she also had a sore throat and unable to manage her secretions secondary to painful swallowing.  She was seen at the dentist office yesterday and was found to have a tooth infection and was given an antibiotics.  Pain and swelling got worse.  Denies any fever or chills.  No nausea or vomiting.  No difficulty breathing.  Per patient she started getting some tooth ache for about a week, swelling started yesterday morning when she went to see her dentist and they gave her some antibiotics, could not remember the name.  She started taking it, just took 1 dose but this morning pain and swelling got worse, unable to eat and drink, even talking hurts prompted her to come to ED. No sick contacts.  No recent change in her bowel habits.  No urinary symptoms.  ED Course: Hemodynamically stable, labs pertinent for neutrophilic predominant leukocytosis, strep throat PCR and mononucleosis screen was negative. CT soft tissue neck shows inflammatory changes of cellulitis involving the left submandibular space, extended inferiorly and posteriorly to involve the left submandibular gland, ball of left hypopharynx and left aryepiglottic fold with narrowing of hypopharynx.  Most likely represents cellulitis and phlegmon, potentially due to carious left third mandibular molar with overlying lingual cortical dehiscence and phlegmonous changes.  No drainable collection at this time. ENT was consulted by ED provider and they recommend admission  with IV antibiotics for concern of the swelling involving the trachea.  Review of Systems: As per HPI otherwise 10 point review of systems negative.   Past Medical History:  Diagnosis Date  . Anxiety   . IBS (irritable bowel syndrome)   . Panic attacks     History reviewed. No pertinent surgical history.   reports that she has never smoked. She has never used smokeless tobacco. She reports current alcohol use. No history on file for drug use.  Allergies  Allergen Reactions  . Nickel Itching  . Percocet [Oxycodone-Acetaminophen] Itching  . Vicodin [Hydrocodone-Acetaminophen] Hives    History reviewed. No pertinent family history.  Prior to Admission medications   Medication Sig Start Date End Date Taking? Authorizing Provider  ibuprofen (ADVIL,MOTRIN) 800 MG tablet Take 800 mg by mouth as needed for mild pain.     [provider]    Physical Exam: Vitals:   07/25/20 1012 07/25/20 1013 07/25/20 1329  BP: 118/83  122/78  Pulse: 85  83  Resp: 18  20  Temp: 99 F (37.2 C)  99 F (37.2 C)  TempSrc: Oral  Oral  SpO2: 97%  98%  Weight:  89.8 kg   Height:  5\' 7"  (1.702 m)     General: Vital signs reviewed.  Patient is well-developed and well-nourished, in no acute distress and cooperative with exam.  Head: Normocephalic and atraumatic. Eyes: EOMI, conjunctivae normal, no scleral icterus.  ENMT: Mucous membranes are dry.  Unable to see posterior pharynx as patient is unable to open mouth. Neck: Significant left jaw edema involving the left side of the neck. Cardiovascular:  RRR, S1 normal, S2 normal, no murmurs, gallops, or rubs. Pulmonary/Chest: Clear to auscultation bilaterally, no wheezes, rales, or rhonchi. Abdominal: Soft, non-tender, non-distended, BS +, Extremities: No lower extremity edema bilaterally,  pulses symmetric and intact bilaterally. No cyanosis or clubbing. Neurological: A&O x3, Strength is normal and symmetric bilaterally, cranial nerve II-XII  are grossly intact, no focal motor deficit, sensory intact to light touch bilaterally.  Skin: Warm, dry and intact. No rashes or erythema. Psychiatric: Normal mood and affect. speech and behavior is normal. Cognition and memory are normal.   Labs on Admission: I have personally reviewed following labs and imaging studies  CBC: Recent Labs  Lab 07/25/20 1135  WBC 12.4*  NEUTROABS 9.3*  HGB 13.1  HCT 39.9  MCV 86.9  PLT 339   Basic Metabolic Panel: Recent Labs  Lab 07/25/20 1135  NA 136  K 4.4  CL 104  CO2 24  GLUCOSE 91  BUN 8  CREATININE 0.87  CALCIUM 8.8*   GFR: Estimated Creatinine Clearance: 106.8 mL/min (by C-G formula based on SCr of 0.87 mg/dL). Liver Function Tests: Recent Labs  Lab 07/25/20 1135  AST 26  ALT 7  ALKPHOS 109  BILITOT 1.8*  PROT 7.6  ALBUMIN 3.7   No results for input(s): LIPASE, AMYLASE in the last 168 hours. No results for input(s): AMMONIA in the last 168 hours. Coagulation Profile: No results for input(s): INR, PROTIME in the last 168 hours. Cardiac Enzymes: No results for input(s): CKTOTAL, CKMB, CKMBINDEX, TROPONINI in the last 168 hours. BNP (last 3 results) No results for input(s): PROBNP in the last 8760 hours. HbA1C: No results for input(s): HGBA1C in the last 72 hours. CBG: No results for input(s): GLUCAP in the last 168 hours. Lipid Profile: No results for input(s): CHOL, HDL, LDLCALC, TRIG, CHOLHDL, LDLDIRECT in the last 72 hours. Thyroid Function Tests: No results for input(s): TSH, T4TOTAL, FREET4, T3FREE, THYROIDAB in the last 72 hours. Anemia Panel: No results for input(s): VITAMINB12, FOLATE, FERRITIN, TIBC, IRON, RETICCTPCT in the last 72 hours. Urine analysis:    Component Value Date/Time   COLORURINE Yellow 11/23/2013 1037   APPEARANCEUR Hazy 11/23/2013 1037   LABSPEC 1.024 11/23/2013 1037   PHURINE 5.0 11/23/2013 1037   GLUCOSEU Negative 11/23/2013 1037   HGBUR Negative 11/23/2013 1037   BILIRUBINUR  Negative 11/23/2013 1037   KETONESUR Negative 11/23/2013 1037   PROTEINUR Negative 11/23/2013 1037   NITRITE Negative 11/23/2013 1037   LEUKOCYTESUR 2+ 11/23/2013 1037    Radiological Exams on Admission: CT Soft Tissue Neck W Contrast  Result Date: 07/25/2020 CLINICAL DATA:  Patient will call with left neck swelling dysphagia. Concern for neck abscess, deep tissue. Dental pain. EXAM: CT NECK WITH CONTRAST TECHNIQUE: Multidetector CT imaging of the neck was performed using the standard protocol following the bolus administration of intravenous contrast. CONTRAST:  37mL OMNIPAQUE IOHEXOL 300 MG/ML  SOLN COMPARISON:  None. FINDINGS: Dental, pharynx and larynx: Carious left third mandibular molar with lingual cortical dehiscence along its root (series 4, image 36). Adjacent edema suggestive of phlegmonous change (series 2, image 36) without discrete drainable fluid collection. There is stranding in the adjacent left submandibular space. Thickening of the left platysma. Abnormal edema and soft tissue thickening extends inferiorly to involve the posterior left wall of the hypopharynx and the left aryepiglottic fold with associated narrowing of the hypopharynx (series 2, image 44). 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 Salivary  glands: The left submandibular gland is enlarged and edematous with stranding in the adjacent left submandibular space. Thyroid: Normal. Lymph nodes: Asymmetrically prominent left greater than right submandibular and cervical chain lymph nodes without evidence of suppuration. Vascular: Limited evaluation due to timing without specific evidence of large vessel occlusion in the neck. Limited intracranial: No visible acute abnormality on limited assessment. Visualized orbits: Unremarkable Mastoids and visualized paranasal sinuses: Clear. Skeleton: No acute or aggressive process. Upper chest:  Visualized lung apices are clear. IMPRESSION: 1. Inflammatory changes in the left submandibular space, extending inferiorly and posteriorly to involve the left submandibular gland, wall of the left hypopharynx and left aryepiglottic fold with narrowing of the hypopharynx, as detailed above. Findings most likely represent cellulitis and phlegmon, potentially related to a carious left third mandibular molar with overlying lingual cortical dehiscence and phlegmonous change. No discrete drainable fluid collection at this time. 2. 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 Electronically Signed   By: Feliberto Harts MD   On: 07/25/2020 13:18    Assessment/Plan Active Problems:   Cellulitis and abscess of mouth   Cellulitis involving the jaw and hypopharynx.  Most likely secondary to dental infection.  No obvious fluid collection for drainage.  No difficulty breathing at this time.  Unable to swallow due to pain. -Admit to MedSurg -Unasyn -LR at 100 mL/h for 1 day as patient appears dry. -Toradol 30 mg IV every 6 hourly for 24-hour for pain and inflammation.  Elevated T bili.  Might be due to dehydration as she appears dry.  No other abnormality. -Monitor with IV fluid.   DVT prophylaxis: Lovenox Code Status: Full code Family Communication: Discussed with patient. Disposition Plan: Home  Consults called: ENT  Admission status: Observation   Arnetha Courser MD [facility]  If 7PM-7AM, please contact night-coverage [email or web address]  07/25/2020, 3:24 PM   This record has been created using Conservation officer, historic buildings. Errors have been sought and corrected,but may not always be located. Such creation errors do not reflect on the standard of care.

## 2020-07-26 DIAGNOSIS — K122 Cellulitis and abscess of mouth: Secondary | ICD-10-CM | POA: Diagnosis not present

## 2020-07-26 DIAGNOSIS — M272 Inflammatory conditions of jaws: Secondary | ICD-10-CM | POA: Diagnosis not present

## 2020-07-26 LAB — CBC
HCT: 35.4 % — ABNORMAL LOW (ref 36.0–46.0)
Hemoglobin: 11.6 g/dL — ABNORMAL LOW (ref 12.0–15.0)
MCH: 28.4 pg (ref 26.0–34.0)
MCHC: 32.8 g/dL (ref 30.0–36.0)
MCV: 86.8 fL (ref 80.0–100.0)
Platelets: 327 10*3/uL (ref 150–400)
RBC: 4.08 MIL/uL (ref 3.87–5.11)
RDW: 12.5 % (ref 11.5–15.5)
WBC: 13.3 10*3/uL — ABNORMAL HIGH (ref 4.0–10.5)
nRBC: 0 % (ref 0.0–0.2)

## 2020-07-26 LAB — COMPREHENSIVE METABOLIC PANEL
ALT: 13 U/L (ref 0–44)
AST: 14 U/L — ABNORMAL LOW (ref 15–41)
Albumin: 3.4 g/dL — ABNORMAL LOW (ref 3.5–5.0)
Alkaline Phosphatase: 98 U/L (ref 38–126)
Anion gap: 7 (ref 5–15)
BUN: 7 mg/dL (ref 6–20)
CO2: 25 mmol/L (ref 22–32)
Calcium: 8.8 mg/dL — ABNORMAL LOW (ref 8.9–10.3)
Chloride: 105 mmol/L (ref 98–111)
Creatinine, Ser: 0.6 mg/dL (ref 0.44–1.00)
GFR, Estimated: >60 mL/min (ref >60)
Glucose, Bld: 128 mg/dL — ABNORMAL HIGH (ref 70–99)
Potassium: 3.8 mmol/L (ref 3.5–5.1)
Sodium: 137 mmol/L (ref 135–145)
Total Bilirubin: 0.6 mg/dL (ref 0.3–1.2)
Total Protein: 6.9 g/dL (ref 6.5–8.1)

## 2020-07-26 MED ORDER — NAPROXEN 500 MG PO TABS: 500.0000 mg | ORAL_TABLET | Freq: Two times a day (BID) | ORAL | 0 refills | Status: AC

## 2020-07-26 MED ORDER — AMOXICILLIN-POT CLAVULANATE 875-125 MG PO TABS: 1.0000 | ORAL_TABLET | Freq: Two times a day (BID) | ORAL | 0 refills | Status: AC

## 2020-07-26 NOTE — Discharge Summary (Signed)
Physician Discharge Summary  Carrie Hebert HKV:[id number] DOB: October 26, 1985 DOA: 07/25/2020  PCP: [facility]  Admit date: 07/25/2020 Discharge date: 07/26/2020  Admitted From: Home Disposition: Home  Recommendations for Outpatient Follow-up:  1. Follow up with PCP in 1-2 weeks 2. Follow-up with dentist 3. Please obtain BMP/CBC in one week 4. Please follow up on the following pending results: None  Home Health: No Equipment/Devices: None Discharge Condition: Stable CODE STATUS: Full Diet recommendation: Heart Healthy   Brief/Interim Summary: Carrie Hebert is a 35 y.o. female with no significant medical history came to ED with complaint of left-sided jaw pain and swelling that extends down into the neck.  Per patient she also had a sore throat and unable to manage her secretions secondary to painful swallowing.  She was seen at the dentist office yesterday and was found to have a tooth infection and was given an antibiotics.  Pain and swelling got worse.  Denies any fever or chills.  No nausea or vomiting.  No difficulty breathing.  Per patient she started getting some tooth ache for about a week, swelling started yesterday morning when she went to see her dentist and they gave her some antibiotics, could not remember the name.  She started taking it, just took 1 dose but this morning pain and swelling got worse, unable to eat and drink, even talking hurts prompted her to come to ED.  CT soft tissue neck shows inflammatory changes of cellulitis involving the left submandibular space, extended inferiorly and posteriorly to involve the left submandibular gland, ball of left hypopharynx and left aryepiglottic fold with narrowing of hypopharynx.  Most likely represents cellulitis and phlegmon, potentially due to carious left third mandibular molar with overlying lingual cortical dehiscence and phlegmonous changes.  No drainable collection at this time. ENT  was consulted by ED provider and they recommend admission with IV antibiotics for concern of the swelling involving the trachea. Patient received Unasyn and Toradol while in the hospital.  Next morning symptoms improved and she was able to tolerate liquid and soft diet. She was discharged on 10 days of Augmentin and advised to follow-up with her dentist for definitive treatment of that infected tooth.  She will continue rest of her home meds and follow-up with her providers.  Discharge Diagnoses:  Active Problems:   Cellulitis and abscess of mouth   Discharge Instructions  Discharge Instructions    Diet - low sodium heart healthy   Complete by: As directed    Discharge instructions   Complete by: As directed    It was pleasure taking care of you. You are being given Augmentin double strength for 10 days, if you have your prior prescription you have to take it 3 times a day with meals as the strength was little different. You are also being given naproxen which is a anti-inflammatory pain medicine, you can take it twice a day every day for the next 3 to 4 days and then only as needed.  If you need something else in between for pain you can take Tylenol but no ibuprofen or aspirin. Keep yourself well-hydrated and keep working on your nourishment. Follow-up with your dentist for further management of your tooth.   Increase activity slowly   Complete by: As directed      Allergies as of 07/26/2020      Reactions   Nickel Itching   Percocet [oxycodone-acetaminophen] Itching   Vicodin [hydrocodone-acetaminophen] Hives      Medication  List    STOP taking these medications   amoxicillin-clavulanate 500-125 MG tablet Commonly known as: AUGMENTIN Replaced by: amoxicillin-clavulanate 875-125 MG tablet   ibuprofen 800 MG tablet Commonly known as: ADVIL     TAKE these medications   amoxicillin-clavulanate 875-125 MG tablet Commonly known as: AUGMENTIN Take 1 tablet by mouth 2 (two)  times daily for 10 days. Replaces: amoxicillin-clavulanate 500-125 MG tablet   cetirizine 10 MG tablet Commonly known as: ZYRTEC Take 10 mg by mouth daily. For allergies   citalopram 40 MG tablet Commonly known as: CELEXA Take 40 mg by mouth daily.   ergocalciferol 1.25 MG (50000 UT) capsule Commonly known as: VITAMIN D2 Take 50,000 Units by mouth daily.   fluticasone 50 MCG/ACT nasal spray Commonly known as: FLONASE Place 2 sprays into both nostrils daily.   metoprolol tartrate 25 MG tablet Commonly known as: LOPRESSOR Take 25 mg by mouth 2 (two) times daily.   naproxen 500 MG tablet Commonly known as: Naprosyn Take 1 tablet (500 mg total) by mouth 2 (two) times daily with a meal for 3 days. And then only as needed   vitamin B-12 1000 MCG tablet Commonly known as: CYANOCOBALAMIN Take 1,000 mcg by mouth daily.       Follow-up Information    [facility]. Schedule an appointment as soon as possible for a visit.   Specialty: General Practice Contact information: [address] [place] [address] [phone]              Allergies  Allergen Reactions  . Nickel Itching  . Percocet [Oxycodone-Acetaminophen] Itching  . Vicodin [Hydrocodone-Acetaminophen] Hives    Consultations:  ENT  Procedures/Studies: CT Soft Tissue Neck W Contrast  Result Date: 07/25/2020 CLINICAL DATA:  Patient will call with left neck swelling dysphagia. Concern for neck abscess, deep tissue. Dental pain. EXAM: CT NECK WITH CONTRAST TECHNIQUE: Multidetector CT imaging of the neck was performed using the standard protocol following the bolus administration of intravenous contrast. CONTRAST:  75mL OMNIPAQUE IOHEXOL 300 MG/ML  SOLN COMPARISON:  None. FINDINGS: Dental, pharynx and larynx: Carious left third mandibular molar with lingual cortical dehiscence along its root (series 4, image 36). Adjacent edema suggestive of phlegmonous change (series 2,  image 36) without discrete drainable fluid collection. There is stranding in the adjacent left submandibular space. Thickening of the left platysma. Abnormal edema and soft tissue thickening extends inferiorly to involve the posterior left wall of the hypopharynx and the left aryepiglottic fold with associated narrowing of the hypopharynx (series 2, image 44). 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 Salivary glands: The left submandibular gland is enlarged and edematous with stranding in the adjacent left submandibular space. Thyroid: Normal. Lymph nodes: Asymmetrically prominent left greater than right submandibular and cervical chain lymph nodes without evidence of suppuration. Vascular: Limited evaluation due to timing without specific evidence of large vessel occlusion in the neck. Limited intracranial: No visible acute abnormality on limited assessment. Visualized orbits: Unremarkable Mastoids and visualized paranasal sinuses: Clear. Skeleton: No acute or aggressive process. Upper chest: Visualized lung apices are clear. IMPRESSION: 1. Inflammatory changes in the left submandibular space, extending inferiorly and posteriorly to involve the left submandibular gland, wall of the left hypopharynx and left aryepiglottic fold with narrowing of the hypopharynx, as detailed above. Findings most likely represent cellulitis and phlegmon, potentially related to a carious left third mandibular molar with overlying lingual cortical  dehiscence and phlegmonous change. No discrete drainable fluid collection at this time. 2. 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 Electronically Signed   By: Feliberto Harts MD   On: 07/25/2020 13:18     Subjective: Patient was seen and examined today.Jaw pain and swelling improving.  Able to take liquid and soft food.   Still cannot open full mouth.  No difficulty breathing, able to manage her secretions.  Discharge Exam: Vitals:   07/26/20 0002 07/26/20 0825  BP: 117/73 123/76  Pulse: 71 71  Resp: 16 16  Temp: 98.3 F (36.8 C) 98 F (36.7 C)  SpO2: 99% 98%   Vitals:   07/25/20 1606 07/25/20 1954 07/26/20 0002 07/26/20 0825  BP: 124/81 123/78 117/73 123/76  Pulse: 84 87 71 71  Resp: 17 16 16 16   Temp: 98.3 F (36.8 C) 98.8 F (37.1 C) 98.3 F (36.8 C) 98 F (36.7 C)  TempSrc:  Oral Oral   SpO2: 99% 99% 99% 98%  Weight:      Height:        General: Pt is alert, awake, not in acute distress Cardiovascular: RRR, S1/S2 +, no rubs, no gallops Respiratory: CTA bilaterally, no wheezing, no rhonchi Abdominal: Soft, NT, ND, bowel sounds + Extremities: no edema, no cyanosis   The results of significant diagnostics from this hospitalization (including imaging, microbiology, ancillary and laboratory) are listed below for reference.    Microbiology: Recent Results (from the past 240 hour(s))  Group A Strep by PCR ([facility] Only)     Status: None   Collection Time: 07/25/20 11:35 AM   Specimen: Throat; Sterile Swab  Result Value Ref Range Status   Group A Strep by PCR NOT DETECTED NOT DETECTED Final    Comment: Performed at [facility], [address], [place], [place]  Resp Panel by RT-PCR (Flu A&B, Covid) Nasopharyngeal Swab     Status: None   Collection Time: 07/25/20  3:07 PM   Specimen: Nasopharyngeal Swab; Nasopharyngeal(NP) swabs in vial transport medium  Result Value Ref Range Status   SARS Coronavirus 2 by RT PCR NEGATIVE NEGATIVE Final    Comment: (NOTE) SARS-CoV-2 target nucleic acids are NOT DETECTED.  The SARS-CoV-2 RNA is generally detectable in upper respiratory specimens during the acute phase of infection. The lowest concentration of SARS-CoV-2 viral copies this assay can detect is 138 copies/mL. A negative result does not preclude SARS-Cov-2 infection  and should not be used as the sole basis for treatment or other patient management decisions. A negative result may occur with  improper specimen collection/handling, submission of specimen other than nasopharyngeal swab, presence of viral mutation(s) within the areas targeted by this assay, and inadequate number of viral copies(<138 copies/mL). A negative result must be combined with clinical observations, patient history, and epidemiological information. The expected result is Negative.  Fact Sheet for Patients:  07/27/20  Fact Sheet for Healthcare Providers:  BloggerCourse.com  This test is no t yet approved or cleared by the [place] FDA and  has been authorized for detection and/or diagnosis of SARS-CoV-2 by FDA under an Emergency Use Authorization (EUA). This EUA will remain  in effect (meaning this test can be used) for the duration of the COVID-19 declaration under Section 564(b)(1) of the Act, 21 U.S.C.section 360bbb-3(b)(1), unless the authorization is terminated  or revoked sooner.       Influenza A by PCR NEGATIVE NEGATIVE Final   Influenza B  by PCR NEGATIVE NEGATIVE Final    Comment: (NOTE) The Xpert Xpress SARS-CoV-2/FLU/RSV plus assay is intended as an aid in the diagnosis of influenza from Nasopharyngeal swab specimens and should not be used as a sole basis for treatment. Nasal washings and aspirates are unacceptable for Xpert Xpress SARS-CoV-2/FLU/RSV testing.  Fact Sheet for Patients: BloggerCourse.comhttps://www.fda.gov/media/152166/download  Fact Sheet for Healthcare Providers: SeriousBroker.ithttps://www.fda.gov/media/152162/download  This test is not yet approved or cleared by the [place] FDA and has been authorized for detection and/or diagnosis of SARS-CoV-2 by FDA under an Emergency Use Authorization (EUA). This EUA will remain in effect (meaning this test can be used) for the duration of the COVID-19 declaration  under Section 564(b)(1) of the Act, 21 U.S.C. section 360bbb-3(b)(1), unless the authorization is terminated or revoked.  Performed at [facility], [address] [place], [place] [id number]      Labs: BNP (last 3 results) No results for input(s): BNP in the last 8760 hours. Basic Metabolic Panel: Recent Labs  Lab 07/25/20 1135 07/25/20 1626 07/26/20 0443  NA 136  --  137  K 4.4  --  3.8  CL 104  --  105  CO2 24  --  25  GLUCOSE 91  --  128*  BUN 8  --  7  CREATININE 0.87 0.67 0.60  CALCIUM 8.8*  --  8.8*   Liver Function Tests: Recent Labs  Lab 07/25/20 1135 07/26/20 0443  AST 26 14*  ALT 7 13  ALKPHOS 109 98  BILITOT 1.8* 0.6  PROT 7.6 6.9  ALBUMIN 3.7 3.4*   No results for input(s): LIPASE, AMYLASE in the last 168 hours. No results for input(s): AMMONIA in the last 168 hours. CBC: Recent Labs  Lab 07/25/20 1135 07/25/20 1626 07/26/20 0443  WBC 12.4* 13.2* 13.3*  NEUTROABS 9.3*  --   --   HGB 13.1 12.8 11.6*  HCT 39.9 38.9 35.4*  MCV 86.9 85.9 86.8  PLT 339 342 327   Cardiac Enzymes: No results for input(s): CKTOTAL, CKMB, CKMBINDEX, TROPONINI in the last 168 hours. BNP: Invalid input(s): POCBNP CBG: No results for input(s): GLUCAP in the last 168 hours. D-Dimer No results for input(s): DDIMER in the last 72 hours. Hgb A1c No results for input(s): HGBA1C in the last 72 hours. Lipid Profile No results for input(s): CHOL, HDL, LDLCALC, TRIG, CHOLHDL, LDLDIRECT in the last 72 hours. Thyroid function studies No results for input(s): TSH, T4TOTAL, T3FREE, THYROIDAB in the last 72 hours.  Invalid input(s): FREET3 Anemia work up No results for input(s): VITAMINB12, FOLATE, FERRITIN, TIBC, IRON, RETICCTPCT in the last 72 hours. Urinalysis    Component Value Date/Time   COLORURINE Yellow 11/23/2013 1037   APPEARANCEUR Hazy 11/23/2013 1037   LABSPEC 1.024 11/23/2013 1037   PHURINE 5.0 11/23/2013 1037   GLUCOSEU Negative 11/23/2013 1037    HGBUR Negative 11/23/2013 1037   BILIRUBINUR Negative 11/23/2013 1037   KETONESUR Negative 11/23/2013 1037   PROTEINUR Negative 11/23/2013 1037   NITRITE Negative 11/23/2013 1037   LEUKOCYTESUR 2+ 11/23/2013 1037   Sepsis Labs Invalid input(s): PROCALCITONIN,  WBC,  LACTICIDVEN Microbiology Recent Results (from the past 240 hour(s))  Group A Strep by PCR ([facility] Only)     Status: None   Collection Time: 07/25/20 11:35 AM   Specimen: Throat; Sterile Swab  Result Value Ref Range Status   Group A Strep by PCR NOT DETECTED NOT DETECTED Final    Comment: Performed at [facility], [address]  [place], [place] [address]  Resp Panel by RT-PCR (Flu A&B, Covid) Nasopharyngeal Swab     Status: None   Collection Time: 07/25/20  3:07 PM   Specimen: Nasopharyngeal Swab; Nasopharyngeal(NP) swabs in vial transport medium  Result Value Ref Range Status   SARS Coronavirus 2 by RT PCR NEGATIVE NEGATIVE Final    Comment: (NOTE) SARS-CoV-2 target nucleic acids are NOT DETECTED.  The SARS-CoV-2 RNA is generally detectable in upper respiratory specimens during the acute phase of infection. The lowest concentration of SARS-CoV-2 viral copies this assay can detect is 138 copies/mL. A negative result does not preclude SARS-Cov-2 infection and should not be used as the sole basis for treatment or other patient management decisions. A negative result may occur with  improper specimen collection/handling, submission of specimen other than nasopharyngeal swab, presence of viral mutation(s) within the areas targeted by this assay, and inadequate number of viral copies(<138 copies/mL). A negative result must be combined with clinical observations, patient history, and epidemiological information. The expected result is Negative.  Fact Sheet for Patients:  BloggerCourse.com  Fact Sheet for Healthcare Providers:  SeriousBroker.it  This test  is no t yet approved or cleared by the [place] FDA and  has been authorized for detection and/or diagnosis of SARS-CoV-2 by FDA under an Emergency Use Authorization (EUA). This EUA will remain  in effect (meaning this test can be used) for the duration of the COVID-19 declaration under Section 564(b)(1) of the Act, 21 U.S.C.section 360bbb-3(b)(1), unless the authorization is terminated  or revoked sooner.       Influenza A by PCR NEGATIVE NEGATIVE Final   Influenza B by PCR NEGATIVE NEGATIVE Final    Comment: (NOTE) The Xpert Xpress SARS-CoV-2/FLU/RSV plus assay is intended as an aid in the diagnosis of influenza from Nasopharyngeal swab specimens and should not be used as a sole basis for treatment. Nasal washings and aspirates are unacceptable for Xpert Xpress SARS-CoV-2/FLU/RSV testing.  Fact Sheet for Patients: BloggerCourse.com  Fact Sheet for Healthcare Providers: SeriousBroker.it  This test is not yet approved or cleared by the [place] FDA and has been authorized for detection and/or diagnosis of SARS-CoV-2 by FDA under an Emergency Use Authorization (EUA). This EUA will remain in effect (meaning this test can be used) for the duration of the COVID-19 declaration under Section 564(b)(1) of the Act, 21 U.S.C. section 360bbb-3(b)(1), unless the authorization is terminated or revoked.  Performed at [facility], [address] [place], [place] [address]     Time coordinating discharge: Over 30 minutes  SIGNED:  Arnetha Courser, MD  [facility] 07/26/2020, 11:50 AM  If 7PM-7AM, please contact night-coverage [email or web address]  This record has been created using Conservation officer, historic buildings. Errors have been sought and corrected,but may not always be located. Such creation errors do not reflect on the standard of care.

## 2020-07-26 NOTE — Plan of Care (Signed)
Patient discharged home per MD orders at this time.All discharge instructions,education and medications reviewed with patient at bed side.Follow up appointments also communicated to Pt.Patient expressed understanding and will comply with discharge instructions. No verbal c/o or any ssx of distress at this time.Pt transported self home in her car.

## 2020-12-12 ENCOUNTER — Other Ambulatory Visit: Payer: Self-pay

## 2020-12-12 ENCOUNTER — Emergency Department: Payer: 59

## 2020-12-12 ENCOUNTER — Emergency Department
Admission: EM | Admit: 2020-12-12 | Discharge: 2020-12-12 | Disposition: A | Discharge status: Home / Self Care | Payer: 59 | Attending: Student in an Organized Health Care Education/Training Program | Admitting: Student in an Organized Health Care Education/Training Program

## 2020-12-12 DIAGNOSIS — M25512 Pain in left shoulder: Secondary | ICD-10-CM | POA: Diagnosis not present

## 2020-12-12 DIAGNOSIS — R0789 Other chest pain: Secondary | ICD-10-CM | POA: Insufficient documentation

## 2020-12-12 DIAGNOSIS — Z79899 Other long term (current) drug therapy: Secondary | ICD-10-CM | POA: Insufficient documentation

## 2020-12-12 LAB — BASIC METABOLIC PANEL
Anion gap: 7 (ref 5–15)
BUN: 8 mg/dL (ref 6–20)
CO2: 25 mmol/L (ref 22–32)
Calcium: 9 mg/dL (ref 8.9–10.3)
Chloride: 104 mmol/L (ref 98–111)
Creatinine, Ser: 0.8 mg/dL (ref 0.44–1.00)
GFR, Estimated: >60 mL/min (ref >60)
Glucose, Bld: 96 mg/dL (ref 70–99)
Potassium: 3.7 mmol/L (ref 3.5–5.1)
Sodium: 136 mmol/L (ref 135–145)

## 2020-12-12 LAB — CBC
HCT: 40.8 % (ref 36.0–46.0)
Hemoglobin: 13.7 g/dL (ref 12.0–15.0)
MCH: 29.1 pg (ref 26.0–34.0)
MCHC: 33.6 g/dL (ref 30.0–36.0)
MCV: 86.8 fL (ref 80.0–100.0)
Platelets: 283 10*3/uL (ref 150–400)
RBC: 4.7 MIL/uL (ref 3.87–5.11)
RDW: 13.4 % (ref 11.5–15.5)
WBC: 7.1 10*3/uL (ref 4.0–10.5)
nRBC: 0 % (ref 0.0–0.2)

## 2020-12-12 LAB — POC URINE PREG, ED: Preg Test, Ur: NEGATIVE

## 2020-12-12 LAB — TROPONIN I (HIGH SENSITIVITY)
Troponin I (High Sensitivity): 3 ng/L (ref <18)
Troponin I (High Sensitivity): 3 ng/L (ref <18)

## 2020-12-12 MED ORDER — IBUPROFEN 600 MG PO TABS
600.0000 mg | ORAL_TABLET | Freq: Once | ORAL | Status: AC
  Administered 2020-12-12: 600 mg via ORAL
  Filled 2020-12-12: qty 1

## 2020-12-12 NOTE — ED Triage Notes (Signed)
Pt here with CP that started last night and has been consistent. Pt states pain jumps between the left and center of her chest and down here left arm. Pt states pain is tight and sharp. Pt has a hx of POTS and is on a beta blocker. Pt denies N/V/D.

## 2020-12-12 NOTE — ED Notes (Signed)
Patient stable and discharged with all personal belongings and AVS. AVS and discharge instructions reviewed with patient and opportunity for questions provided.   

## 2020-12-12 NOTE — ED Provider Notes (Signed)
[facility] Emergency Department Provider Note    Event Date/Time   First MD Initiated Contact with Patient 12/12/20 1227     (approximate)  I have reviewed the triage vital signs and the nursing notes.   HISTORY  Chief Complaint Chest Pain    HPI Carrie Hebert is a 35 y.o. female with below listed past medical history presents to the ER for evaluation of left anterior chest wall pain as well as shoulder pain.  Has been having episodes like this before.  Felt like it was little bit more uncomfortable than usual denies any trauma.  Does have some mild discomfort with deep inspiration has a history of bronchitis.  She is on OCP birth control.  Denies any history of DVT.  No history of ACS or cardiac issues.  Does not smoke.  Denies any abdominal pain.  Denies any pain ripping or tearing through to her back.  Past Medical History:  Diagnosis Date   Anxiety    IBS (irritable bowel syndrome)    Panic attacks    No family history on file. No past surgical history on file. Patient Active Problem List   Diagnosis Date Noted   Cellulitis and abscess of mouth 07/25/2020   Odontogenic infection of jaw       Prior to Admission medications   Medication Sig Start Date End Date Taking? Authorizing Provider  cetirizine (ZYRTEC) 10 MG tablet Take 10 mg by mouth daily. For allergies 07/03/20   [provider]  citalopram (CELEXA) 40 MG tablet Take 40 mg by mouth daily.    [provider]  ergocalciferol (VITAMIN D2) 1.25 MG (50000 UT) capsule Take 50,000 Units by mouth daily.    [provider]  fluticasone (FLONASE) 50 MCG/ACT nasal spray Place 2 sprays into both nostrils daily.    [provider]  metoprolol tartrate (LOPRESSOR) 25 MG tablet Take 25 mg by mouth 2 (two) times daily. 02/14/20   [provider]  vitamin B-12 (CYANOCOBALAMIN) 1000 MCG tablet Take 1,000 mcg by mouth daily.    [provider]     Allergies Nickel, Percocet [oxycodone-acetaminophen], and Vicodin [hydrocodone-acetaminophen]    Social History Social History   Tobacco Use   Smoking status: Never   Smokeless tobacco: Never  Substance Use Topics   Alcohol use: Yes    Comment: couple of times a week    Review of Systems Patient denies headaches, rhinorrhea, blurry vision, numbness, shortness of breath, chest pain, edema, cough, abdominal pain, nausea, vomiting, diarrhea, dysuria, fevers, rashes or hallucinations unless otherwise stated above in HPI. ____________________________________________   PHYSICAL EXAM:  VITAL SIGNS: Vitals:   12/12/20 1119  BP: 124/74  Pulse: 79  Resp: 18  Temp: 98.2 F (36.8 C)  SpO2: 100%    Constitutional: Alert and oriented.  Eyes: Conjunctivae are normal.  Head: Atraumatic. Nose: No congestion/rhinnorhea. Mouth/Throat: Mucous membranes are moist.   Neck: No stridor. Painless ROM.  Cardiovascular: Normal rate, regular rhythm. Grossly normal heart sounds.  Good peripheral circulation. Respiratory: Normal respiratory effort.  No retractions. Lungs CTAB. Gastrointestinal: Soft and nontender. No distention. No abdominal bruits. No CVA tenderness. Genitourinary:  Musculoskeletal: pain reproduced with palpation of left anterior chest wall. No lower extremity tenderness nor edema.  No joint effusions. Neurologic:  Normal speech and language. No gross focal neurologic deficits are appreciated. No facial droop Skin:  Skin is warm, dry and intact. No rash noted. Psychiatric: Mood and affect are normal. Speech and  behavior are normal.  ____________________________________________   LABS (all labs ordered are listed, but only abnormal results are displayed)  Results for orders placed or performed during the hospital encounter of 12/12/20 (from the past 24 hour(s))  Basic metabolic panel     Status: None   Collection Time: 12/12/20 11:22 AM  Result Value Ref Range    Sodium 136 135 - 145 mmol/L   Potassium 3.7 3.5 - 5.1 mmol/L   Chloride 104 98 - 111 mmol/L   CO2 25 22 - 32 mmol/L   Glucose, Bld 96 70 - 99 mg/dL   BUN 8 6 - 20 mg/dL   Creatinine, Ser 7.68 0.44 - 1.00 mg/dL   Calcium 9.0 8.9 - 11.5 mg/dL   GFR, Estimated >72 >62 mL/min   Anion gap 7 5 - 15  CBC     Status: None   Collection Time: 12/12/20 11:22 AM  Result Value Ref Range   WBC 7.1 4.0 - 10.5 K/uL   RBC 4.70 3.87 - 5.11 MIL/uL   Hemoglobin 13.7 12.0 - 15.0 g/dL   HCT 03.5 59.7 - 41.6 %   MCV 86.8 80.0 - 100.0 fL   MCH 29.1 26.0 - 34.0 pg   MCHC 33.6 30.0 - 36.0 g/dL   RDW 38.4 53.6 - 46.8 %   Platelets 283 150 - 400 K/uL   nRBC 0.0 0.0 - 0.2 %  Troponin I (High Sensitivity)     Status: None   Collection Time: 12/12/20 11:22 AM  Result Value Ref Range   Troponin I (High Sensitivity) 3 <18 ng/L  Troponin I (High Sensitivity)     Status: None   Collection Time: 12/12/20 12:57 PM  Result Value Ref Range   Troponin I (High Sensitivity) 3 <18 ng/L  POC urine preg, ED     Status: None   Collection Time: 12/12/20  1:01 PM  Result Value Ref Range   Preg Test, Ur Negative Negative   ____________________________________________  EKG My review and personal interpretation at Time: 11:17   Indication: chest pain  Rate: 75  Rhythm: sinus Axis: normal Other: normal intervals, no stmei ____________________________________________  RADIOLOGY  I personally reviewed all radiographic images ordered to evaluate for the above acute complaints and reviewed radiology reports and findings.  These findings were personally discussed with the patient.  Please see medical record for radiology report.  ____________________________________________   PROCEDURES  Procedure(s) performed:  Procedures    Critical Care performed: no ____________________________________________   INITIAL IMPRESSION / ASSESSMENT AND PLAN / ED COURSE  Pertinent labs & imaging results that were available  during my care of the patient were reviewed by me and considered in my medical decision making (see chart for details).   DDX: Musculoskeletal strain, pericarditis, ACS, pneumonia, pneumothorax, fracture, dislocation, PE  Carrie Hebert is a 35 y.o. who presents to the ED with presentation as described above.  Patient clinically well-appearing with reproducible chest wall pain.  Does not seem consistent with dissection.  She is low risk by Wells criteria and is PERC negative.  Low risk by heart score EKG nonischemic.  Troponins negative.  Abdominal exam soft and benign.  Patient stable and appropriate for outpatient follow-up.     The patient was evaluated in Emergency Department today for the symptoms described in the history of present illness. He/she was evaluated in the context of the global COVID-19 pandemic, which necessitated consideration that the patient might be at risk for infection with the SARS-CoV-2 virus  that causes COVID-19. Institutional protocols and algorithms that pertain to the evaluation of patients at risk for COVID-19 are in a state of rapid change based on information released by regulatory bodies including the CDC and federal and state organizations. These policies and algorithms were followed during the patient's care in the ED.  As part of my medical decision making, I reviewed the following data within the electronic MEDICAL RECORD NUMBER Nursing notes reviewed and incorporated, Labs reviewed, notes from prior ED visits and [place] Controlled Substance Database   ____________________________________________   FINAL CLINICAL IMPRESSION(S) / ED DIAGNOSES  Final diagnoses:  None      NEW MEDICATIONS STARTED DURING THIS VISIT:  New Prescriptions   No medications on file     Note:  This document was prepared using Dragon voice recognition software and may include unintentional dictation errors.    Willy Eddy, MD 12/12/20 [phone]

## 2021-01-30 ENCOUNTER — Emergency Department
Admission: EM | Admit: 2021-01-30 | Discharge: 2021-01-30 | Disposition: A | Discharge status: Home / Self Care | Payer: 59 | Attending: Emergency Medicine | Admitting: Emergency Medicine

## 2021-01-30 ENCOUNTER — Other Ambulatory Visit: Payer: Self-pay

## 2021-01-30 DIAGNOSIS — K0889 Other specified disorders of teeth and supporting structures: Secondary | ICD-10-CM | POA: Insufficient documentation

## 2021-01-30 MED ORDER — OXYCODONE-ACETAMINOPHEN 5-325 MG PO TABS
2.0000 | ORAL_TABLET | Freq: Once | ORAL | Status: AC
  Administered 2021-01-30: 2 via ORAL
  Filled 2021-01-30: qty 2

## 2021-01-30 MED ORDER — CLINDAMYCIN HCL 300 MG PO CAPS: 300.0000 mg | ORAL_CAPSULE | Freq: Four times a day (QID) | ORAL | 0 refills | Status: AC

## 2021-01-30 MED ORDER — ONDANSETRON 4 MG PO TBDP
4.0000 mg | ORAL_TABLET | Freq: Once | ORAL | Status: AC
  Administered 2021-01-30: 4 mg via ORAL
  Filled 2021-01-30: qty 1

## 2021-01-30 MED ORDER — KETOROLAC TROMETHAMINE 30 MG/ML IJ SOLN
30.0000 mg | Freq: Once | INTRAMUSCULAR | Status: AC
  Administered 2021-01-30: 30 mg via INTRAMUSCULAR
  Filled 2021-01-30: qty 1

## 2021-01-30 MED ORDER — DIPHENHYDRAMINE HCL 25 MG PO CAPS
25.0000 mg | ORAL_CAPSULE | Freq: Once | ORAL | Status: AC
  Administered 2021-01-30: 25 mg via ORAL
  Filled 2021-01-30: qty 1

## 2021-01-30 NOTE — Discharge Instructions (Signed)
OPTIONS FOR DENTAL FOLLOW UP CARE ° °[place] Department of Health and Human Services - Local Safety Net Dental Clinics °http://www.ncdhhs.gov/dph/oralhealth/services/safetynetclinics.htm °  °[facility] (336-562-3123) ° °[facility] [place] ([phone]) ° °[facility] ([phone] ext [phone]) ° °[facility] ([phone]) ° °[facility] ([phone]) °This clinic caters to the indigent population and is on a lottery system. °Location: °[facility], [address], [place] °Clinic Hours: °Wednesdays from 6pm - 9pm, patients seen by a lottery system. °For dates, call or go to [email or web address].[facility].edu/[facility]/patients/Dental-[facility] °Services: °Cleanings, fillings and simple extractions. °Payment Options: °DENTAL WORK IS FREE OF CHARGE. Bring proof of income or support. °Best way to get seen: °Arrive at 5:15 pm - this is a lottery, NOT first come/first serve, so arriving earlier will not increase your chances of being seen. °  °  °[facility] °[phone] °Select option 1 for emergencies °  °Location: °[facility], [address], [place] °Clinic Hours: °No walk-ins accepted - call the day before to schedule an appointment. °Check in times are 9:30 am and 1:30 pm. °Services: °Simple extractions, temporary fillings, pulpectomy/pulp debridement, uncomplicated abscess drainage. °Payment Options: °PAYMENT IS DUE AT THE TIME OF SERVICE.  Fee is usually $100-200, additional surgical procedures (e.g. abscess drainage) may be extra. °Cash, checks, Visa/MasterCard accepted.  Can file Medicaid if patient is covered for dental - patient should call case worker to check. °No discount for [facility] patients. °Best way to get seen: °MUST call the day before and get onto the schedule. Can usually be seen the next 1-2 days. No walk-ins accepted. °  °  °[facility] °[phone] °   °Location: °[facility], [address], [place] °Clinic Hours: °M, W, Th, F 8am or 1:30pm, Tues 9a or 1:30 - first come/first served. °Services: °Simple extractions, temporary fillings, uncomplicated abscess drainage.  You do not need to be an [place] resident. °Payment Options: °PAYMENT IS DUE AT THE TIME OF SERVICE. °Dental insurance, otherwise sliding scale - bring proof of income or support. °Depending on income and treatment needed, cost is usually $50-200. °Best way to get seen: °Arrive early as it is first come/first served. °  °  °[facility] °[phone] °  °Location: °[address] °Clinic Hours: °Mon-Thu 8a-5p °Services: °Most basic dental services including extractions and fillings. °Payment Options: °PAYMENT IS DUE AT THE TIME OF SERVICE. °Sliding scale, up to 50% off - bring proof if income or support. °Medicaid with dental option accepted. °Best way to get seen: °Call to schedule an appointment, can usually be seen within 2 weeks OR they will try to see walk-ins - show up at 8a or 2p (you may have to wait). °  °  °[facility] °919-245-2435 °[place] RESIDENTS ONLY °  °Location: °[facility], [address], [place], [place] [address] °Clinic Hours: By appointment only. °Monday - Thursday 8am-5pm, Friday 8am-12pm °Services: Cleanings, fillings, extractions. °Payment Options: °PAYMENT IS DUE AT THE TIME OF SERVICE. °Cash, Visa or MasterCard. Sliding scale - $30 minimum per service. °Best way to get seen: °Come in to office, complete packet and make an appointment - need proof of income °or support monies for each household member and proof of [place] residence. °Usually takes about a month to get in. °  °  °[facility] °[phone] °  °Location: °[address]   [place] °Clinic Hours: Walk-in Urgent Care Dental Services are offered Monday-Friday  mornings only. °The numbers of emergencies accepted daily is limited to the number of °providers available. °Maximum 15 - Mondays, Wednesdays & Thursdays °Maximum 10 - Tuesdays & Fridays °Services: °You do not need to be a [place] resident to be seen for a dental emergency. °Emergencies are defined as pain, swelling, abnormal bleeding, or dental trauma. Walkins will receive x-rays if needed. °NOTE: Dental cleaning is not an emergency. °Payment Options: °PAYMENT IS DUE AT THE TIME OF SERVICE. °Minimum co-pay is $40.00 for uninsured patients. °Minimum co-pay is $3.00 for Medicaid with dental coverage. °Dental Insurance is accepted and must be presented at time of visit. °Medicare does not cover dental. °Forms of payment: Cash, credit card, checks. °Best way to get seen: °If not previously registered with the clinic, walk-in dental registration begins at 7:15 am and is on a first come/first serve basis. °If previously registered with the clinic, call to make an appointment. °  °  °[facility] °[phone] °[place] RESIDENTS ONLY °  °Location: °[address], [place], [place] °Clinic Hours: °Mon-Thu 10a-2p °Services: Extractions only! °Payment Options: °FREE (donations accepted) - bring proof of income or support °Best way to get seen: °Call and schedule an appointment OR come at 8am on the 1st Monday of every month (except for holidays) when it is first come/first served. °  °  °[facility] °[phone] °  °Location: °[address],  °Clinic Hours: °Friday mornings °Services, Payment Options, Best way to get seen: °Call for info °

## 2021-01-30 NOTE — ED Provider Notes (Signed)
[facility]-EMERGENCY DEPARTMENT  ____________________________________________  Time seen: Approximately 3:43 PM  I have reviewed the triage vital signs and the nursing notes.   HISTORY  Chief Complaint Dental Pain   Historian Patient     HPI Carrie Hebert is a 35 y.o. female presents to the emergency department with left-sided dental pain for the past 3 days.  Patient has been to see a local dentist and has been started on on amoxicillin and has been prescribed tramadol for pain.  Patient states that her pain has persisted and she cannot rest due to discomfort.  She denies difficulty swallowing or pain underneath the tongue.  She states that she has nearly finished her second course of amoxicillin with little relief.  She does have a dental care plan for an extraction on November 1.   Past Medical History:  Diagnosis Date   Anxiety    IBS (irritable bowel syndrome)    Panic attacks      Immunizations up to date:  Yes.     Past Medical History:  Diagnosis Date   Anxiety    IBS (irritable bowel syndrome)    Panic attacks     Patient Active Problem List   Diagnosis Date Noted   Cellulitis and abscess of mouth 07/25/2020   Odontogenic infection of jaw     No past surgical history on file.  Prior to Admission medications   Medication Sig Start Date End Date Taking? Authorizing Provider  clindamycin (CLEOCIN) 300 MG capsule Take 1 capsule (300 mg total) by mouth 4 (four) times daily for 10 days. 01/30/21 02/09/21 Yes Pia Mau M, PA-C  cetirizine (ZYRTEC) 10 MG tablet Take 10 mg by mouth daily. For allergies 07/03/20   [provider]  citalopram (CELEXA) 40 MG tablet Take 40 mg by mouth daily.    [provider]  ergocalciferol (VITAMIN D2) 1.25 MG (50000 UT) capsule Take 50,000 Units by mouth daily.    [provider]  fluticasone (FLONASE) 50 MCG/ACT nasal spray Place 2 sprays into both nostrils daily.    [provider]   metoprolol tartrate (LOPRESSOR) 25 MG tablet Take 25 mg by mouth 2 (two) times daily. 02/14/20   [provider]  vitamin B-12 (CYANOCOBALAMIN) 1000 MCG tablet Take 1,000 mcg by mouth daily.    [provider]    Allergies Nickel, Percocet [oxycodone-acetaminophen], and Vicodin [hydrocodone-acetaminophen]  No family history on file.  Social History Social History   Tobacco Use   Smoking status: Never   Smokeless tobacco: Never  Substance Use Topics   Alcohol use: Yes    Comment: couple of times a week     Review of Systems  Constitutional: No fever/chills Eyes:  No discharge ENT: Patient has dental pain.  Respiratory: no cough. No SOB/ use of accessory muscles to breath Gastrointestinal:   No nausea, no vomiting.  No diarrhea.  No constipation. Musculoskeletal: Negative for musculoskeletal pain. Skin: Negative for rash, abrasions, lacerations, ecchymosis.    ____________________________________________   PHYSICAL EXAM:  VITAL SIGNS: ED Triage Vitals [01/30/21 1507]  Enc Vitals Group     BP (!) 128/110     Pulse Rate 99     Resp 18     Temp 98.5 F (36.9 C)     Temp Source Oral     SpO2 100 %     Weight 194 lb (88 kg)     Height 5\' 7"  (1.702 m)     Head Circumference  Peak Flow      Pain Score 10     Pain Loc      Pain Edu?      Excl. in GC?      Constitutional: Alert and oriented. Well appearing and in no acute distress. Eyes: Conjunctivae are normal. PERRL. EOMI. Head: Atraumatic. ENT:      Nose: No congestion/rhinnorhea.      Mouth/Throat: Mucous membranes are moist.  No significant jaw swelling.  Patient is managing her own secretions.  No pain underneath the tongue. Neck: No stridor.  No cervical spine tenderness to palpation. Cardiovascular: Normal rate, regular rhythm. Normal S1 and S2.  Good peripheral circulation. Respiratory: Normal respiratory effort without tachypnea or retractions. Lungs CTAB. Good air entry to the  bases with no decreased or absent breath sounds Gastrointestinal: Bowel sounds x 4 quadrants. Soft and nontender to palpation. No guarding or rigidity. No distention. Musculoskeletal: Full range of motion to all extremities. No obvious deformities noted Neurologic:  Normal for age. No gross focal neurologic deficits are appreciated.  Skin:  Skin is warm, dry and intact. No rash noted. Psychiatric: Mood and affect are normal for age. Speech and behavior are normal.   ____________________________________________   LABS (all labs ordered are listed, but only abnormal results are displayed)  Labs Reviewed - No data to display ____________________________________________  EKG   ____________________________________________  RADIOLOGY   No results found.  ____________________________________________    PROCEDURES  Procedure(s) performed:     Procedures     Medications  oxyCODONE-acetaminophen (PERCOCET/ROXICET) 5-325 MG per tablet 2 tablet (2 tablets Oral Given 01/30/21 1548)  ondansetron (ZOFRAN-ODT) disintegrating tablet 4 mg (4 mg Oral Given 01/30/21 1548)  ketorolac (TORADOL) 30 MG/ML injection 30 mg (30 mg Intramuscular Given 01/30/21 1549)  diphenhydrAMINE (BENADRYL) capsule 25 mg (25 mg Oral Given 01/30/21 1548)     ____________________________________________   INITIAL IMPRESSION / ASSESSMENT AND PLAN / ED COURSE  Pertinent labs & imaging results that were available during my care of the patient were reviewed by me and considered in my medical decision making (see chart for details).    Assessment and plan Dental pain 35 year old female presents to the emergency department with left-sided dental pain with a follow-up appointment with her dentist in approximately 1 week.  As patient is almost out of her amoxicillin, will start her on clindamycin until she can be seen by her dentist.  Recommended starting probiotic.  Patient was given Percocet in the emergency  department.  Explained to patient that I cannot prescribe her additional prescriptions for narcotics as she was recently prescribed tramadol.  Patient voiced understanding.      ____________________________________________  FINAL CLINICAL IMPRESSION(S) / ED DIAGNOSES  Final diagnoses:  Pain, dental      NEW MEDICATIONS STARTED DURING THIS VISIT:  ED Discharge Orders          Ordered    clindamycin (CLEOCIN) 300 MG capsule  4 times daily        01/30/21 1547                This chart was dictated using voice recognition software/Dragon. Despite best efforts to proofread, errors can occur which can change the meaning. Any change was purely unintentional.     Orvil Feil, PA-C 01/30/21 1551    Jene Every, MD 01/30/21 1650

## 2021-01-30 NOTE — ED Triage Notes (Signed)
Pt states that she has been having left sided mouth pain for 3 days, pt went to the dentist and was placed on amoxicillin, tramadol, and ibuprofen, pt is crying in pain, states that the medication hasn't helped yet

## 2021-09-30 ENCOUNTER — Encounter: Payer: Self-pay | Admitting: Emergency Medicine

## 2021-09-30 ENCOUNTER — Emergency Department
Admission: EM | Admit: 2021-09-30 | Discharge: 2021-09-30 | Disposition: A | Discharge status: Home / Self Care | Payer: 59 | Attending: Emergency Medicine | Admitting: Emergency Medicine

## 2021-09-30 ENCOUNTER — Emergency Department: Payer: 59

## 2021-09-30 ENCOUNTER — Other Ambulatory Visit: Payer: Self-pay

## 2021-09-30 DIAGNOSIS — R0789 Other chest pain: Secondary | ICD-10-CM | POA: Diagnosis present

## 2021-09-30 DIAGNOSIS — R0781 Pleurodynia: Secondary | ICD-10-CM | POA: Diagnosis not present

## 2021-09-30 LAB — CBC
HCT: 40.3 % (ref 36.0–46.0)
Hemoglobin: 12.6 g/dL (ref 12.0–15.0)
MCH: 27.8 pg (ref 26.0–34.0)
MCHC: 31.3 g/dL (ref 30.0–36.0)
MCV: 89 fL (ref 80.0–100.0)
Platelets: 319 10*3/uL (ref 150–400)
RBC: 4.53 MIL/uL (ref 3.87–5.11)
RDW: 13.3 % (ref 11.5–15.5)
WBC: 6.1 10*3/uL (ref 4.0–10.5)
nRBC: 0 % (ref 0.0–0.2)

## 2021-09-30 LAB — BASIC METABOLIC PANEL
Anion gap: 6 (ref 5–15)
BUN: 7 mg/dL (ref 6–20)
CO2: 25 mmol/L (ref 22–32)
Calcium: 8.8 mg/dL — ABNORMAL LOW (ref 8.9–10.3)
Chloride: 107 mmol/L (ref 98–111)
Creatinine, Ser: 0.71 mg/dL (ref 0.44–1.00)
GFR, Estimated: >60 mL/min (ref >60)
Glucose, Bld: 97 mg/dL (ref 70–99)
Potassium: 3.8 mmol/L (ref 3.5–5.1)
Sodium: 138 mmol/L (ref 135–145)

## 2021-09-30 LAB — TROPONIN I (HIGH SENSITIVITY): Troponin I (High Sensitivity): 3 ng/L (ref <18)

## 2021-09-30 MED ORDER — NAPROXEN 500 MG PO TABS: 500.0000 mg | ORAL_TABLET | Freq: Two times a day (BID) | ORAL | 2 refills | Status: AC

## 2021-09-30 NOTE — ED Notes (Signed)
Dc ppw provided to patient. quetions, followup and rx information reviewed. Pt provides verbal consent for dc at this time and is alert and oriented x4. ambulatory to lobby. 

## 2021-09-30 NOTE — ED Triage Notes (Signed)
PT here with cp simce last Wynelle Link that is constant. Pt states pain is left sided and radiates to her arm, neck, and back. Pt has hx of POTS and is on a beta blocker that she took today. Pt also c/o left foot swelling.

## 2021-09-30 NOTE — ED Provider Notes (Signed)
[facility] Provider Note    Event Date/Time   First MD Initiated Contact with Patient 09/30/21 1354     (approximate)   History   Chest Pain   HPI  Carrie Hebert is a 36 y.o. female with a history of POTS who presents with complaints of left-sided upper chest pain with some radiation to her left shoulder.  She reports this has been going on for about a week.  She denies shortness of breath.  No fevers chills or cough.  No history of high blood pressure.  No pleurisy.  No injury to the area     Physical Exam   Triage Vital Signs: ED Triage Vitals  Enc Vitals Group     BP 09/30/21 1226 124/83     Pulse Rate 09/30/21 1224 70     Resp 09/30/21 1224 18     Temp 09/30/21 1224 98.1 F (36.7 C)     Temp Source 09/30/21 1224 Oral     SpO2 09/30/21 1224 100 %     Weight 09/30/21 1225 89.8 kg (198 lb)     Height 09/30/21 1225 1.715 m (5' 7.5")     Head Circumference --      Peak Flow --      Pain Score 09/30/21 1225 7     Pain Loc --      Pain Edu? --      Excl. in GC? --     Most recent vital signs: Vitals:   09/30/21 1224 09/30/21 1226  BP:  124/83  Pulse: 70   Resp: 18   Temp: 98.1 F (36.7 C)   SpO2: 100%      General: Awake, no distress.  CV:  Good peripheral perfusion.  Regular rate and rhythm, tenderness palpation over the ribs in the area that she complains of Resp:  Normal effort.  CTA bilaterally Abd:  No distention.  Other:     ED Results / Procedures / Treatments   Labs (all labs ordered are listed, but only abnormal results are displayed) Labs Reviewed  BASIC METABOLIC PANEL - Abnormal; Notable for the following components:      Result Value   Calcium 8.8 (*)    All other components within normal limits  CBC  POC URINE PREG, ED  TROPONIN I (HIGH SENSITIVITY)     EKG  ED ECG REPORT I, Jene Every, the attending physician, personally viewed and interpreted this ECG.  Date: 09/30/2021  Rhythm: normal  sinus rhythm QRS Axis: normal Intervals: normal ST/T Wave abnormalities: normal Narrative Interpretation: no evidence of acute ischemia    RADIOLOGY Chest x-ray viewed interpreted by me, no acute abnormality    PROCEDURES:  Critical Care performed:   Procedures   MEDICATIONS ORDERED IN ED: Medications - No data to display   IMPRESSION / MDM / ASSESSMENT AND PLAN / ED COURSE  I reviewed the triage vital signs and the nursing notes. Patient's presentation is most consistent with acute presentation with potential threat to life or bodily function.   Patient presents with complaints of chest pain as above.  Differential includes chest wall pain, shingles, pneumonia, pneumothorax, less likely ACS  Chest x-ray is negative for pneumonia or pneumothorax.  EKG and high-sensitivity troponin are normal.  No evidence of rash on exam.  Lab work reviewed, normal high sensitive troponin, normal CBC, unremarkable BMP.  Considered admission however I suspect patient has musculoskeletal chest pain, will treat with NSAIDs, outpatient follow-up, strict return  precautions discussed       FINAL CLINICAL IMPRESSION(S) / ED DIAGNOSES   Final diagnoses:  Atypical chest pain     Rx / DC Orders   ED Discharge Orders          Ordered    naproxen (NAPROSYN) 500 MG tablet  2 times daily with meals        09/30/21 1417             Note:  This document was prepared using Dragon voice recognition software and may include unintentional dictation errors.   Jene Every, MD 09/30/21 1511

## 2021-12-27 ENCOUNTER — Encounter: Payer: Self-pay | Admitting: Family Medicine

## 2022-01-17 ENCOUNTER — Other Ambulatory Visit: Payer: Self-pay

## 2022-01-22 ENCOUNTER — Ambulatory Visit (INDEPENDENT_AMBULATORY_CARE_PROVIDER_SITE_OTHER): Payer: 59 | Admitting: Gastroenterology

## 2022-01-22 ENCOUNTER — Encounter: Payer: Self-pay | Admitting: Gastroenterology

## 2022-01-22 VITALS — BP 121/80 | HR 70 | Temp 98.1°F | Ht 67.5 in | Wt 210.1 lb

## 2022-01-22 DIAGNOSIS — R1013 Epigastric pain: Secondary | ICD-10-CM | POA: Diagnosis not present

## 2022-01-22 DIAGNOSIS — K5904 Chronic idiopathic constipation: Secondary | ICD-10-CM

## 2022-01-22 MED ORDER — OMEPRAZOLE 40 MG PO CPDR
40.0000 mg | DELAYED_RELEASE_CAPSULE | Freq: Every day | ORAL | 0 refills | Status: DC
Start: 2022-01-22 — End: 2022-02-17

## 2022-01-22 NOTE — Patient Instructions (Signed)

## 2022-01-22 NOTE — Progress Notes (Signed)
Carrie Repress, MD [address]  [address]  [place], [place] [address]  Main: [phone]  Fax: [phone]    Gastroenterology Consultation  Referring Provider:     Emogene Morgan, MD Primary Care Physician:  [facility] [place] Primary Gastroenterologist:  Dr. Arlyss Hebert Reason for Consultation: Epigastric pain, chronic constipation        HPI:   Carrie Hebert is a 36 y.o. female referred by Dr. Eli Hebert, [facility]  for consultation & management of epigastric pain, chronic constipation.  Patient reports that she has been experiencing pain in the epigastric area, burning in her chest that has been ongoing for more than a month associated with food getting stuck.  She also reports early satiety, feeling full before finishing her meal associated with some nausea and abdominal bloating.  Patient tells me that her PCP has started her on medication for epigastric pain and she does not recollect the name of it, showed me the pill in her pillbox.  She does report irregular bowel movements for a long time, about twice a week only.  She tries to incorporate vegetable in her meal.  She has not tried any stool softeners yet.  Has history of POTS and not very physically active.  She denies any melena, vomiting, rectal bleeding  Patient does not smoke or drink alcohol, denies marijuana use, does not vape  Patient reports that her grandmother had stomach cancer  NSAIDs: Ibuprofen for generalized body aches daily in the morning before her breakfast  Antiplts/Anticoagulants/Anti thrombotics: None  GI Procedures: None  Past Medical History:  Diagnosis Date   Anxiety    IBS (irritable bowel syndrome)    Panic attacks     History reviewed. No pertinent surgical history.   Current Outpatient Medications:    cetirizine (ZYRTEC) 10 MG tablet, Take 10 mg by mouth daily. For allergies, Disp: , Rfl:    citalopram (CELEXA) 20 MG tablet, Take  20 mg by mouth daily., Disp: , Rfl:    fluticasone (FLONASE) 50 MCG/ACT nasal spray, Place 2 sprays into both nostrils daily., Disp: , Rfl:    methocarbamol (ROBAXIN) 500 MG tablet, Take 500-1,000 mg by mouth every 6 (six) hours as needed., Disp: , Rfl:    metoprolol tartrate (LOPRESSOR) 25 MG tablet, Take 25 mg by mouth 2 (two) times daily., Disp: , Rfl:    naproxen (NAPROSYN) 500 MG tablet, Take 1 tablet (500 mg total) by mouth 2 (two) times daily with a meal., Disp: 20 tablet, Rfl: 2   omeprazole (PRILOSEC) 40 MG capsule, Take 1 capsule (40 mg total) by mouth daily before breakfast., Disp: 30 capsule, Rfl: 0   triamcinolone ointment (KENALOG) 0.5 %, SMARTSIG:sparingly Topical Twice Daily PRN, Disp: , Rfl:    vitamin B-12 (CYANOCOBALAMIN) 1000 MCG tablet, Take 1,000 mcg by mouth daily., Disp: , Rfl:    No family history on file.   Social History   Tobacco Use   Smoking status: Never   Smokeless tobacco: Never  Vaping Use   Vaping Use: Never used  Substance Use Topics   Alcohol use: Yes    Comment: couple of times a week   Drug use: Never    Allergies as of 01/22/2022 - Review Complete 01/22/2022  Allergen Reaction Noted   Nickel Itching 01/20/2018   Percocet [oxycodone-acetaminophen] Itching 07/24/2015   Vicodin [hydrocodone-acetaminophen] Hives 07/24/2015    Review of Systems:    All systems reviewed and negative  except where noted in HPI.   Physical Exam:  BP 121/80 (BP Location: Left Arm, Patient Position: Sitting, Cuff Size: Normal)   Pulse 70   Temp 98.1 F (36.7 C) (Oral)   Ht 5' 7.5" (1.715 m)   Wt 210 lb 2 oz (95.3 kg)   BMI 32.42 kg/m  No LMP recorded. (Menstrual status: IUD).  General:   Alert,  Well-developed, well-nourished, pleasant and cooperative in NAD Head:  Normocephalic and atraumatic. Eyes:  Sclera clear, no icterus.   Conjunctiva pink. Ears:  Normal auditory acuity. Nose:  No deformity, discharge, or lesions. Mouth:  No deformity or  lesions,oropharynx pink & moist. Neck:  Supple; no masses or thyromegaly. Lungs:  Respirations even and unlabored.  Clear throughout to auscultation.   No wheezes, crackles, or rhonchi. No acute distress. Heart:  Regular rate and rhythm; no murmurs, clicks, rubs, or gallops. Abdomen:  Normal bowel sounds. Soft, epigastric tenderness and non-distended without masses, hepatosplenomegaly or hernias noted.  No guarding or rebound tenderness.   Rectal: Not performed Msk:  Symmetrical without gross deformities. Good, equal movement & strength bilaterally. Pulses:  Normal pulses noted. Extremities:  No clubbing or edema.  No cyanosis. Neurologic:  Alert and oriented x3;  grossly normal neurologically. Skin:  Intact without significant lesions or rashes. No jaundice. Psych:  Alert and cooperative. Normal mood and affect.  Imaging Studies: No abdominal imaging  Assessment and Plan:   Carrie Hebert is a 36 y.o. female with 1 month history of epigastric pain associated with bloating, early satiety, regurgitation, heartburn as well as chronic constipation  Dyspepsia Recommend H. pylori breath test Start omeprazole 40 mg daily before breakfast Strictly avoid NSAID use If her symptoms are persistent, recommend EGD for further evaluation if EGD is negative, evaluate for gallbladder pathology  Chronic constipation Discussed about high-fiber diet, cut back on red meat consumption, information provided Adequate intake of water Start MiraLAX daily Incorporate physical activity 20 minutes daily  Follow up in 3 to 4 months   Cephas Darby, MD

## 2022-01-24 ENCOUNTER — Telehealth: Payer: Self-pay

## 2022-01-24 LAB — H. PYLORI BREATH TEST: H pylori Breath Test: NEGATIVE

## 2022-01-24 NOTE — Telephone Encounter (Signed)
-----   Message from Lin Landsman, MD sent at 01/24/2022  8:17 AM EDT ----- Negative H Pylori breath test. Continue prilosec as recommended for 1 mont, call us back if her symptoms are persistent  RV

## 2022-01-24 NOTE — Telephone Encounter (Signed)
Patient verbalized understanding of results  

## 2022-02-15 ENCOUNTER — Other Ambulatory Visit: Payer: Self-pay | Admitting: Gastroenterology

## 2022-02-15 DIAGNOSIS — R1013 Epigastric pain: Secondary | ICD-10-CM

## 2022-05-30 ENCOUNTER — Other Ambulatory Visit: Payer: Self-pay

## 2022-06-02 ENCOUNTER — Encounter: Payer: Self-pay | Admitting: Gastroenterology

## 2022-06-02 ENCOUNTER — Ambulatory Visit (INDEPENDENT_AMBULATORY_CARE_PROVIDER_SITE_OTHER): Payer: 59 | Admitting: Gastroenterology

## 2022-06-02 VITALS — BP 122/77 | HR 66 | Temp 98.0°F | Ht 67.5 in | Wt 214.2 lb

## 2022-06-02 DIAGNOSIS — R1013 Epigastric pain: Secondary | ICD-10-CM

## 2022-06-02 DIAGNOSIS — K5904 Chronic idiopathic constipation: Secondary | ICD-10-CM

## 2022-06-02 NOTE — Progress Notes (Signed)
Cephas Darby, MD [address]  [place]  [place], [place] [address]  Main: [phone]  Fax: [phone]    Gastroenterology Consultation  Referring Provider:     [facility] Physician:  [facility] Primary Gastroenterologist:  Dr. Cephas Darby Reason for Consultation: Epigastric pain, chronic constipation        HPI:   Carrie Hebert is a 37 y.o. female referred by [facility]  for consultation & management of epigastric pain, chronic constipation.  Patient reports that she has been experiencing pain in the epigastric area, burning in her chest that has been ongoing for more than a month associated with food getting stuck.  She also reports early satiety, feeling full before finishing her meal associated with some nausea and abdominal bloating.  Patient tells me that her PCP has started her on medication for epigastric pain and she does not recollect the name of it, showed me the pill in her pillbox.  She does report irregular bowel movements for a long time, about twice a week only.  She tries to incorporate vegetable in her meal.  She has not tried any stool softeners yet.  Has history of POTS and not very physically active.  She denies any melena, vomiting, rectal bleeding  Patient does not smoke or drink alcohol, denies marijuana use, does not vape  Follow-up visit 06/02/2022  Patient is here for follow-up of epigastric pain and constipation.  She has been taking omeprazole 40 mg once a day before breakfast which has been helping.  She is no longer experiencing burning pain in her stomach.  Has stopped taking ibuprofen.  She reports feeling well, has been exercising, has incorporated more fruits and vegetables, cut back on carbs and fatty foods, exercises regularly and reports that she has lost few inches but her weight has been stable.  She feels stronger and healthier.  She is no longer  experiencing constipation, taking MiraLAX regularly.  She does not have any concerns today.  Patient reports that her grandmother had stomach cancer   NSAIDs: Ibuprofen for generalized body aches daily in the morning before her breakfast  Antiplts/Anticoagulants/Anti thrombotics: None  GI Procedures: None  Past Medical History:  Diagnosis Date   Anxiety    IBS (irritable bowel syndrome)    Panic attacks     History reviewed. No pertinent surgical history.   Current Outpatient Medications:    cetirizine (ZYRTEC) 10 MG tablet, Take 10 mg by mouth daily. For allergies, Disp: , Rfl:    citalopram (CELEXA) 20 MG tablet, Take 20 mg by mouth daily., Disp: , Rfl:    clobetasol (TEMOVATE) 0.05 % external solution, Apply 1 Application topically as needed., Disp: , Rfl:    fluticasone (FLONASE) 50 MCG/ACT nasal spray, Place 2 sprays into both nostrils daily., Disp: , Rfl:    ketoconazole (NIZORAL) 2 % shampoo, Apply 1 Application topically once a week., Disp: , Rfl:    methocarbamol (ROBAXIN) 500 MG tablet, Take 500-1,000 mg by mouth every 6 (six) hours as needed., Disp: , Rfl:    metoprolol tartrate (LOPRESSOR) 25 MG tablet, Take 25 mg by mouth 2 (two) times daily., Disp: , Rfl:    minocycline (MINOCIN) 100 MG capsule, Take 100 mg by mouth daily., Disp: , Rfl:    naproxen (NAPROSYN) 500 MG tablet, Take 1 tablet (500 mg total) by mouth 2 (two) times daily with a meal., Disp: 20 tablet,  Rfl: 2   omeprazole (PRILOSEC) 40 MG capsule, TAKE 1 CAPSULE BY MOUTH ONCE DAILY BEFORE BREAKFAST, Disp: 30 capsule, Rfl: 2   tretinoin (RETIN-A) 0.025 % cream, Apply topically., Disp: , Rfl:    triamcinolone ointment (KENALOG) 0.5 %, SMARTSIG:sparingly Topical Twice Daily PRN, Disp: , Rfl:    vitamin B-12 (CYANOCOBALAMIN) 1000 MCG tablet, Take 1,000 mcg by mouth daily., Disp: , Rfl:    No family history on file.   Social History   Tobacco Use   Smoking status: Never   Smokeless tobacco: Never  Vaping  Use   Vaping Use: Never used  Substance Use Topics   Alcohol use: Not Currently    Comment: couple of times a week   Drug use: Never    Allergies as of 06/02/2022 - Review Complete 06/02/2022  Allergen Reaction Noted   Nickel Itching 01/20/2018   Percocet [oxycodone-acetaminophen] Itching 07/24/2015   Vicodin [hydrocodone-acetaminophen] Hives 07/24/2015    Review of Systems:    All systems reviewed and negative except where noted in HPI.   Physical Exam:  BP 122/77 (BP Location: Left Arm, Patient Position: Sitting, Cuff Size: Normal)   Pulse 66   Temp 98 F (36.7 C) (Oral)   Ht 5' 7.5" (1.715 m)   Wt 214 lb 4 oz (97.2 kg)   BMI 33.06 kg/m  No LMP recorded. (Menstrual status: IUD).  General:   Alert,  Well-developed, well-nourished, pleasant and cooperative in NAD Head:  Normocephalic and atraumatic. Eyes:  Sclera clear, no icterus.   Conjunctiva pink. Ears:  Normal auditory acuity. Nose:  No deformity, discharge, or lesions. Mouth:  No deformity or lesions,oropharynx pink & moist. Neck:  Supple; no masses or thyromegaly. Lungs:  Respirations even and unlabored.  Clear throughout to auscultation.   No wheezes, crackles, or rhonchi. No acute distress. Heart:  Regular rate and rhythm; no murmurs, clicks, rubs, or gallops. Abdomen:  Normal bowel sounds. Soft, epigastric tenderness and non-distended without masses, hepatosplenomegaly or hernias noted.  No guarding or rebound tenderness.   Rectal: Not performed Msk:  Symmetrical without gross deformities. Good, equal movement & strength bilaterally. Pulses:  Normal pulses noted. Extremities:  No clubbing or edema.  No cyanosis. Neurologic:  Alert and oriented x3;  grossly normal neurologically. Skin:  Intact without significant lesions or rashes. No jaundice. Psych:  Alert and cooperative. Normal mood and affect.  Imaging Studies: No abdominal imaging  Assessment and Plan:   Carrie Hebert is a 37 y.o. female with  1 month history of epigastric pain associated with bloating, early satiety, regurgitation, heartburn as well as chronic constipation  Dyspepsia: Resolved H. pylori breath test negative Symptoms relieved on omeprazole 40 mg daily before breakfast Stopped taking NSAIDs regularly Advised to decrease omeprazole to 20 mg once a day before breakfast and see if it keeps her symptoms under control. Defer upper endoscopy at this time, can pursue further testing if her symptoms recur  Chronic constipation Continue high-fiber diet, cut back on red meat consumption Adequate intake of water Continue MiraLAX daily Incorporate physical activity 20 minutes daily  Follow up as needed   Cephas Darby, MD

## 2023-03-08 IMAGING — CR DG SHOULDER 2+V*L*
1 series · 3 of 3 positions shown · non-contrast
Comparison: None.

CLINICAL DATA: Shoulder pain

EXAM:
LEFT SHOULDER - 2+ VIEW

[Series 1: dg shoulder left · 0.14mm/px · 3 of 3 slices shown]
[im 1/3]
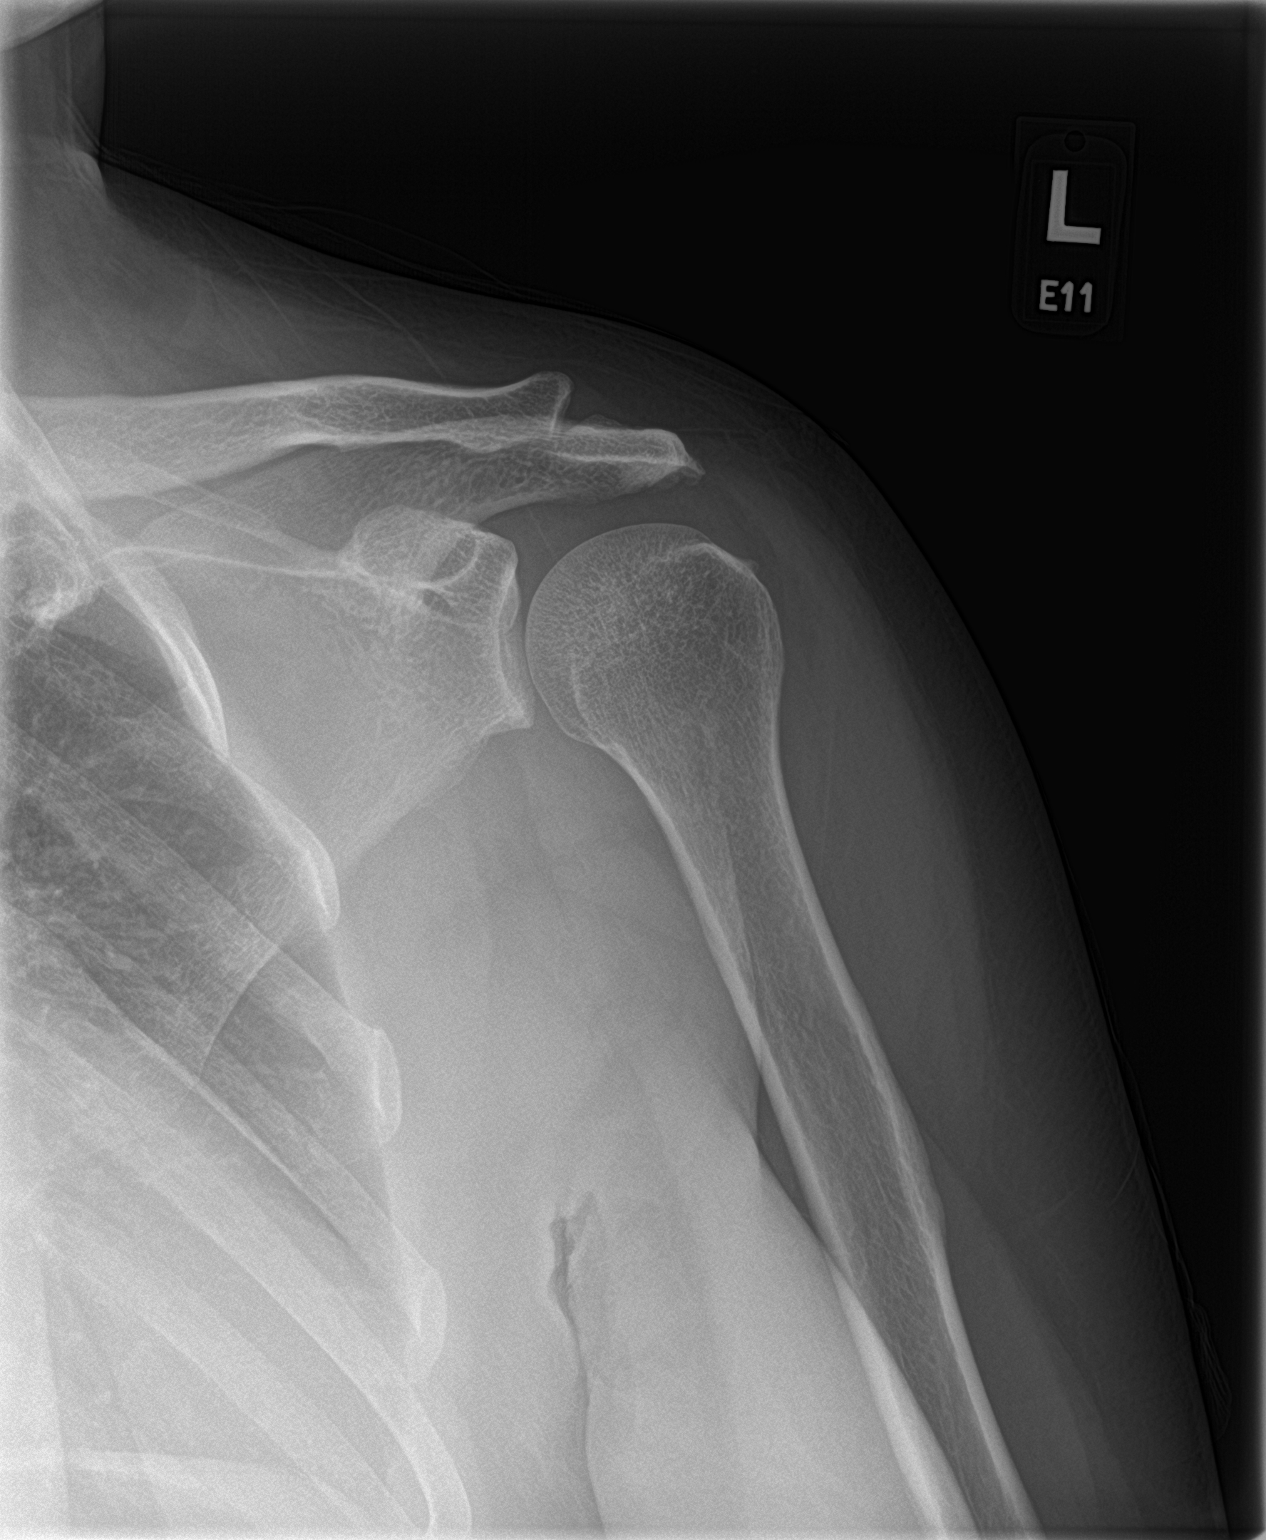
[im 2/3]
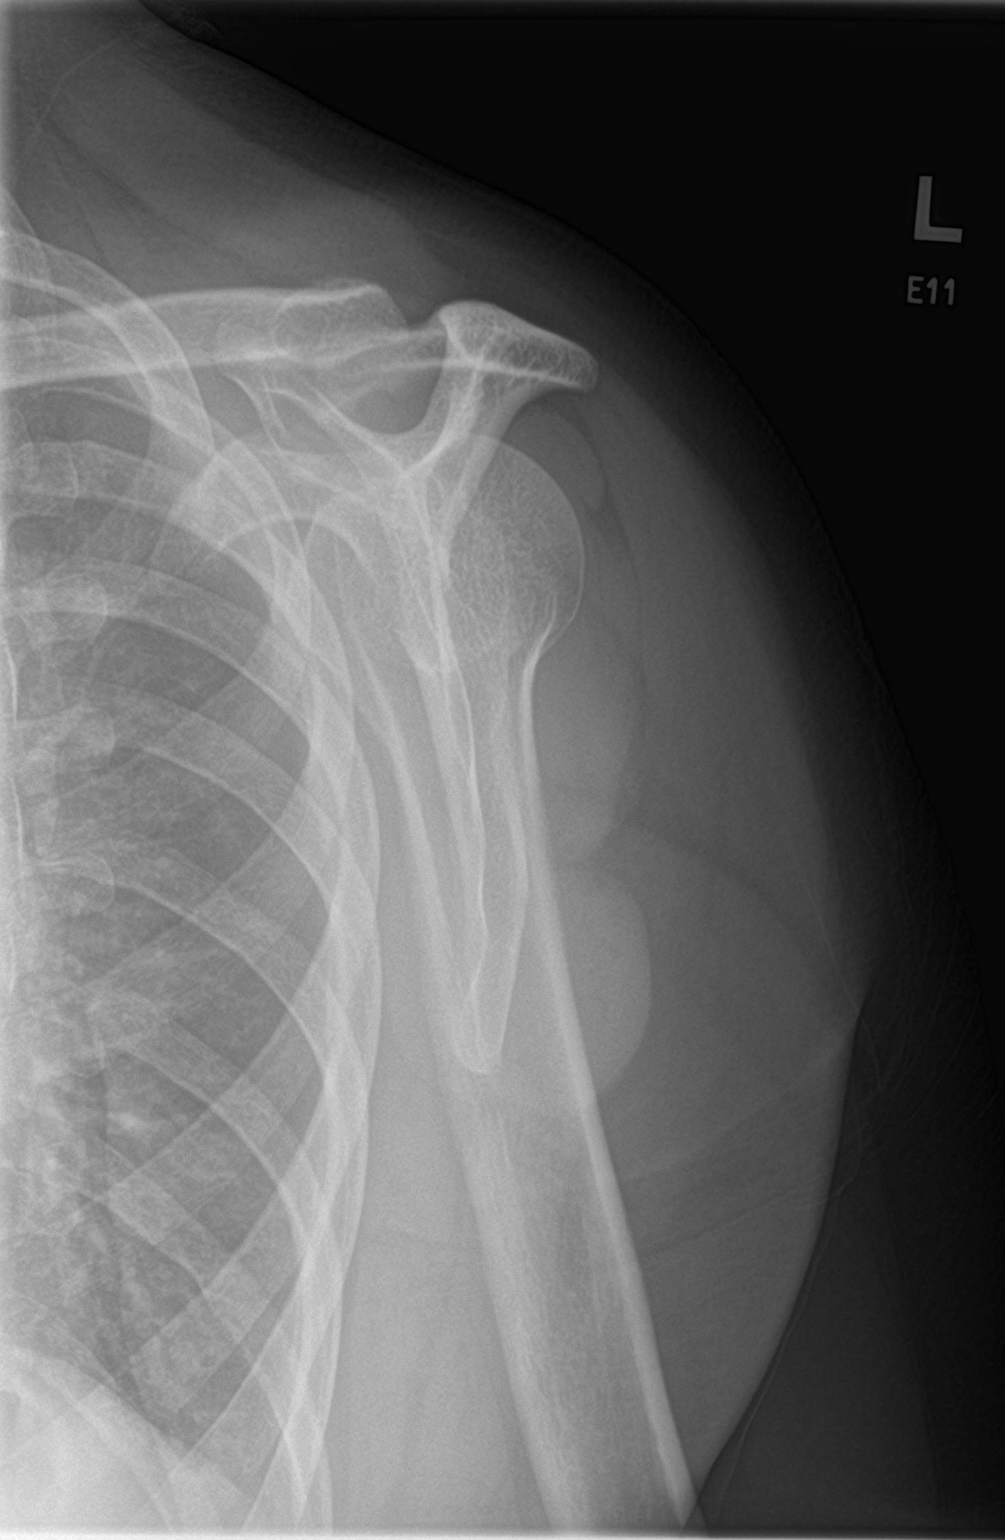
[im 3/3]
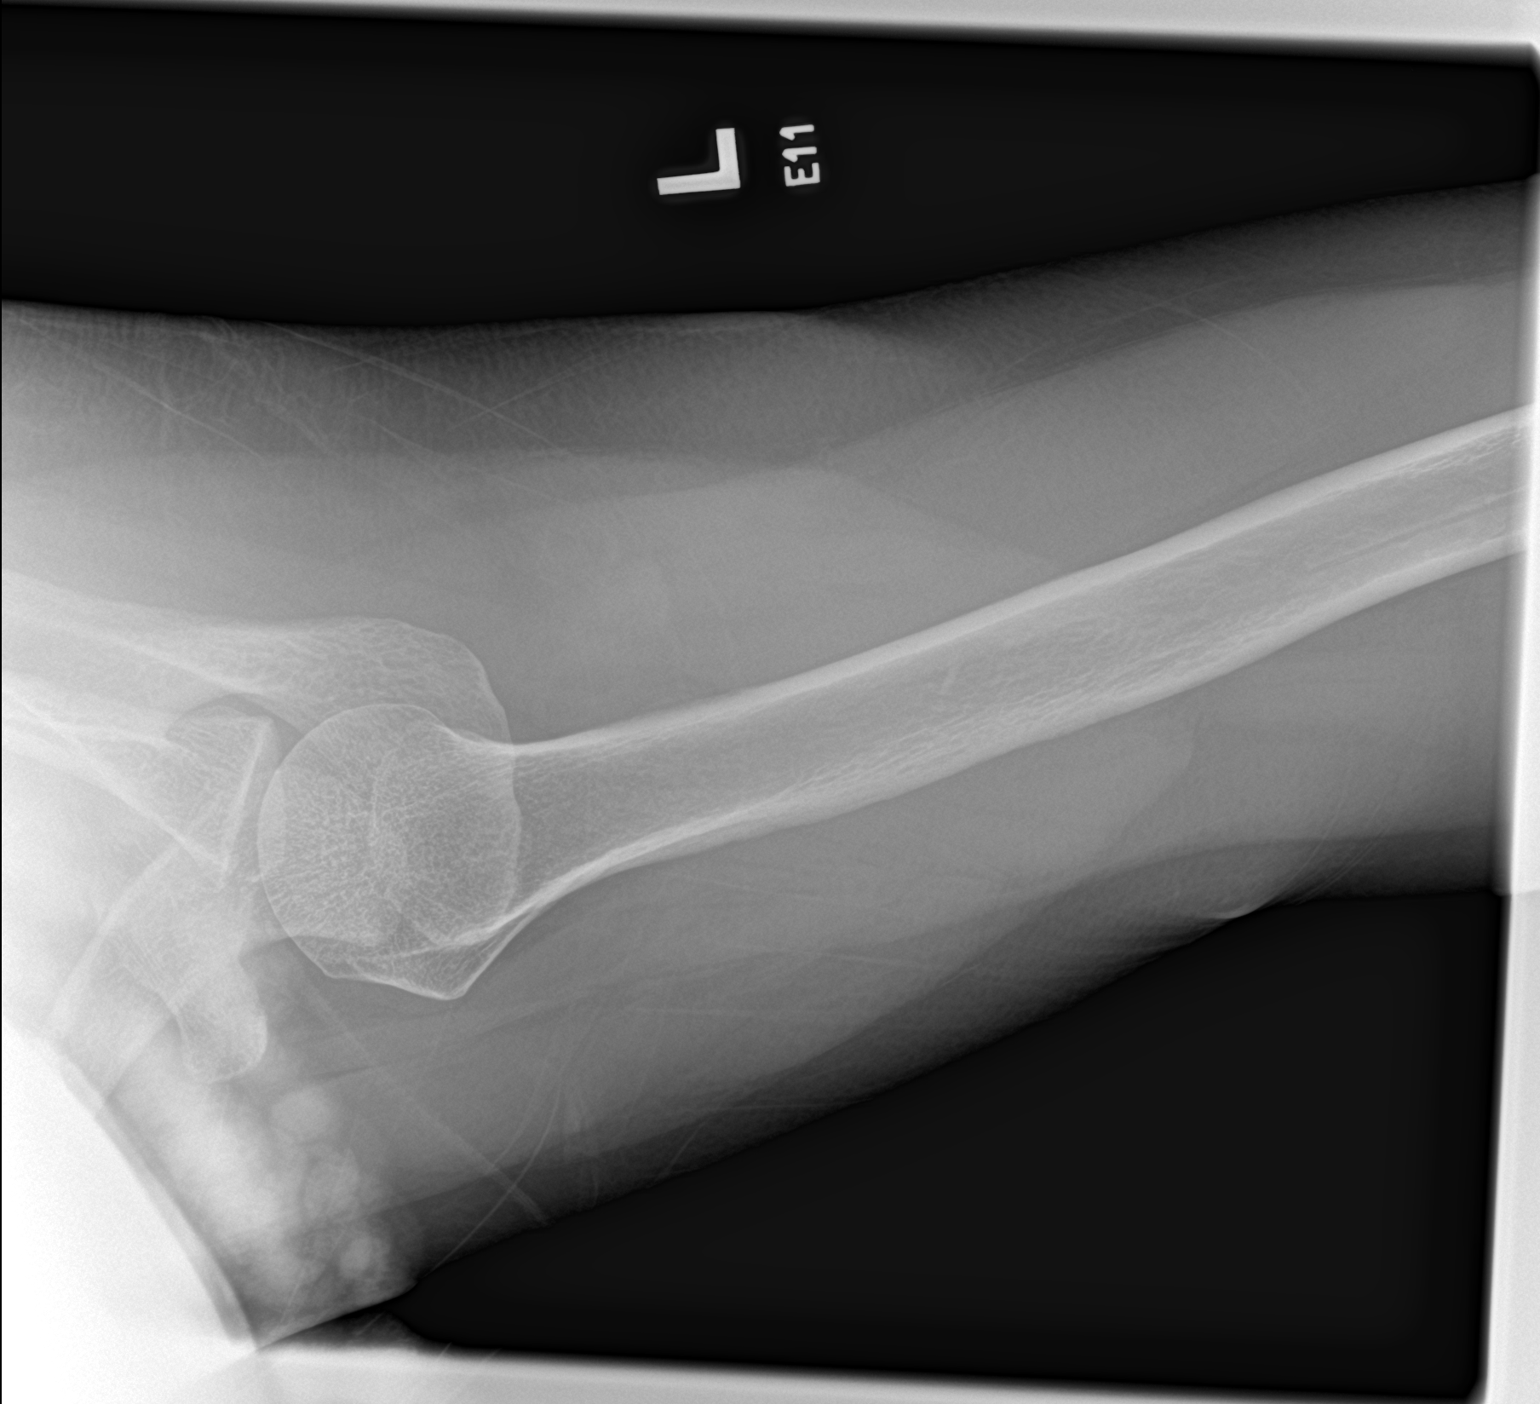

[3 of 3 positions shown; findings below may reference images not displayed]

FINDINGS: There is no evidence of fracture or dislocation. There is no
evidence of arthropathy or other focal bone abnormality. Soft
tissues are unremarkable.
IMPRESSION: Negative.

## 2023-12-25 IMAGING — CR DG CHEST 2V
1 series · 2 of 2 positions shown · non-contrast
Comparison: Radiographs 12/12/2020 and 01/20/2018.

CLINICAL DATA: Constant left-sided chest pain radiating into the
left shoulder, arm and back for 8 days.

EXAM:
CHEST - 2 VIEW

[Series 1: dg chest 2 view · 0.14mm/px · 2 of 2 slices shown]
[im 1/2]
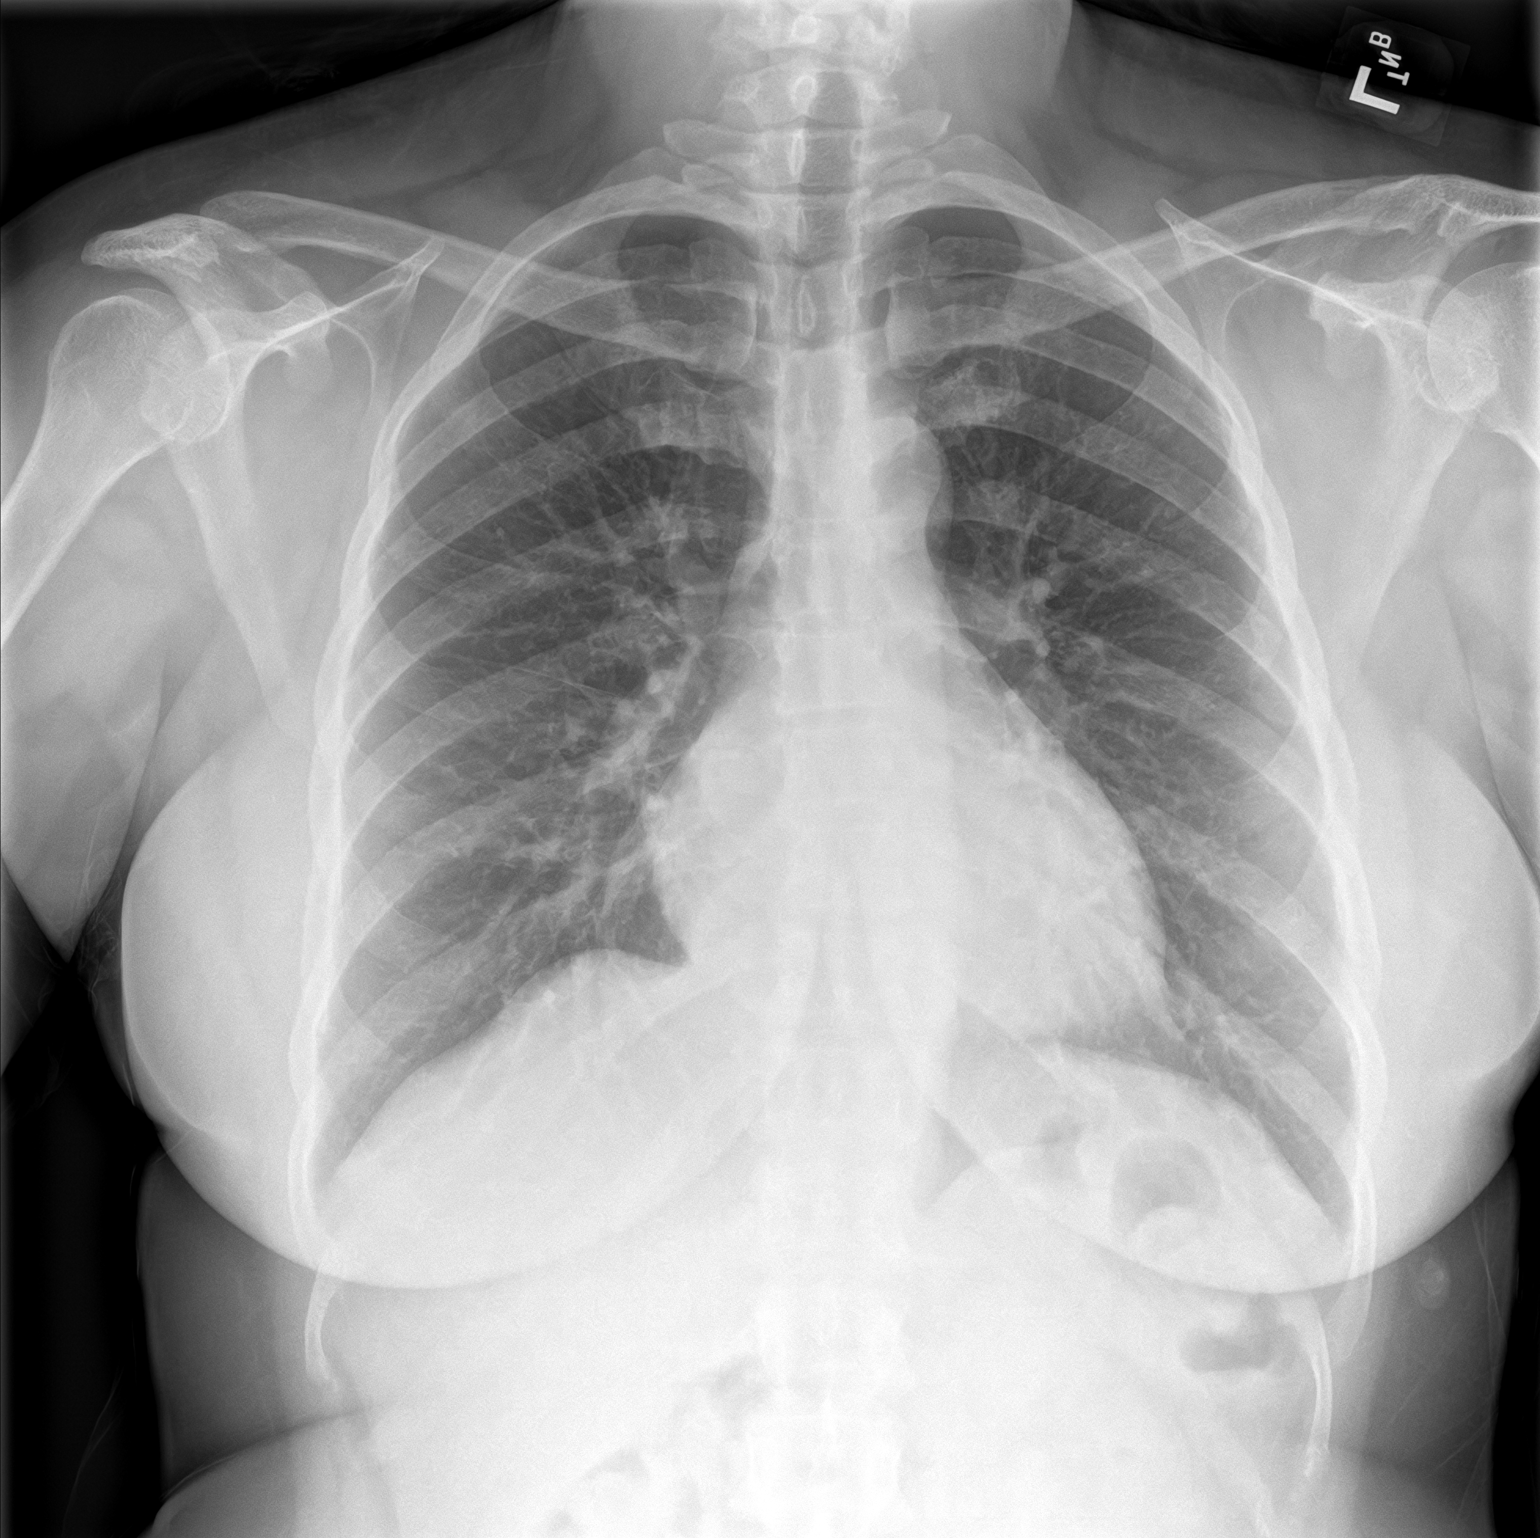
[im 2/2]
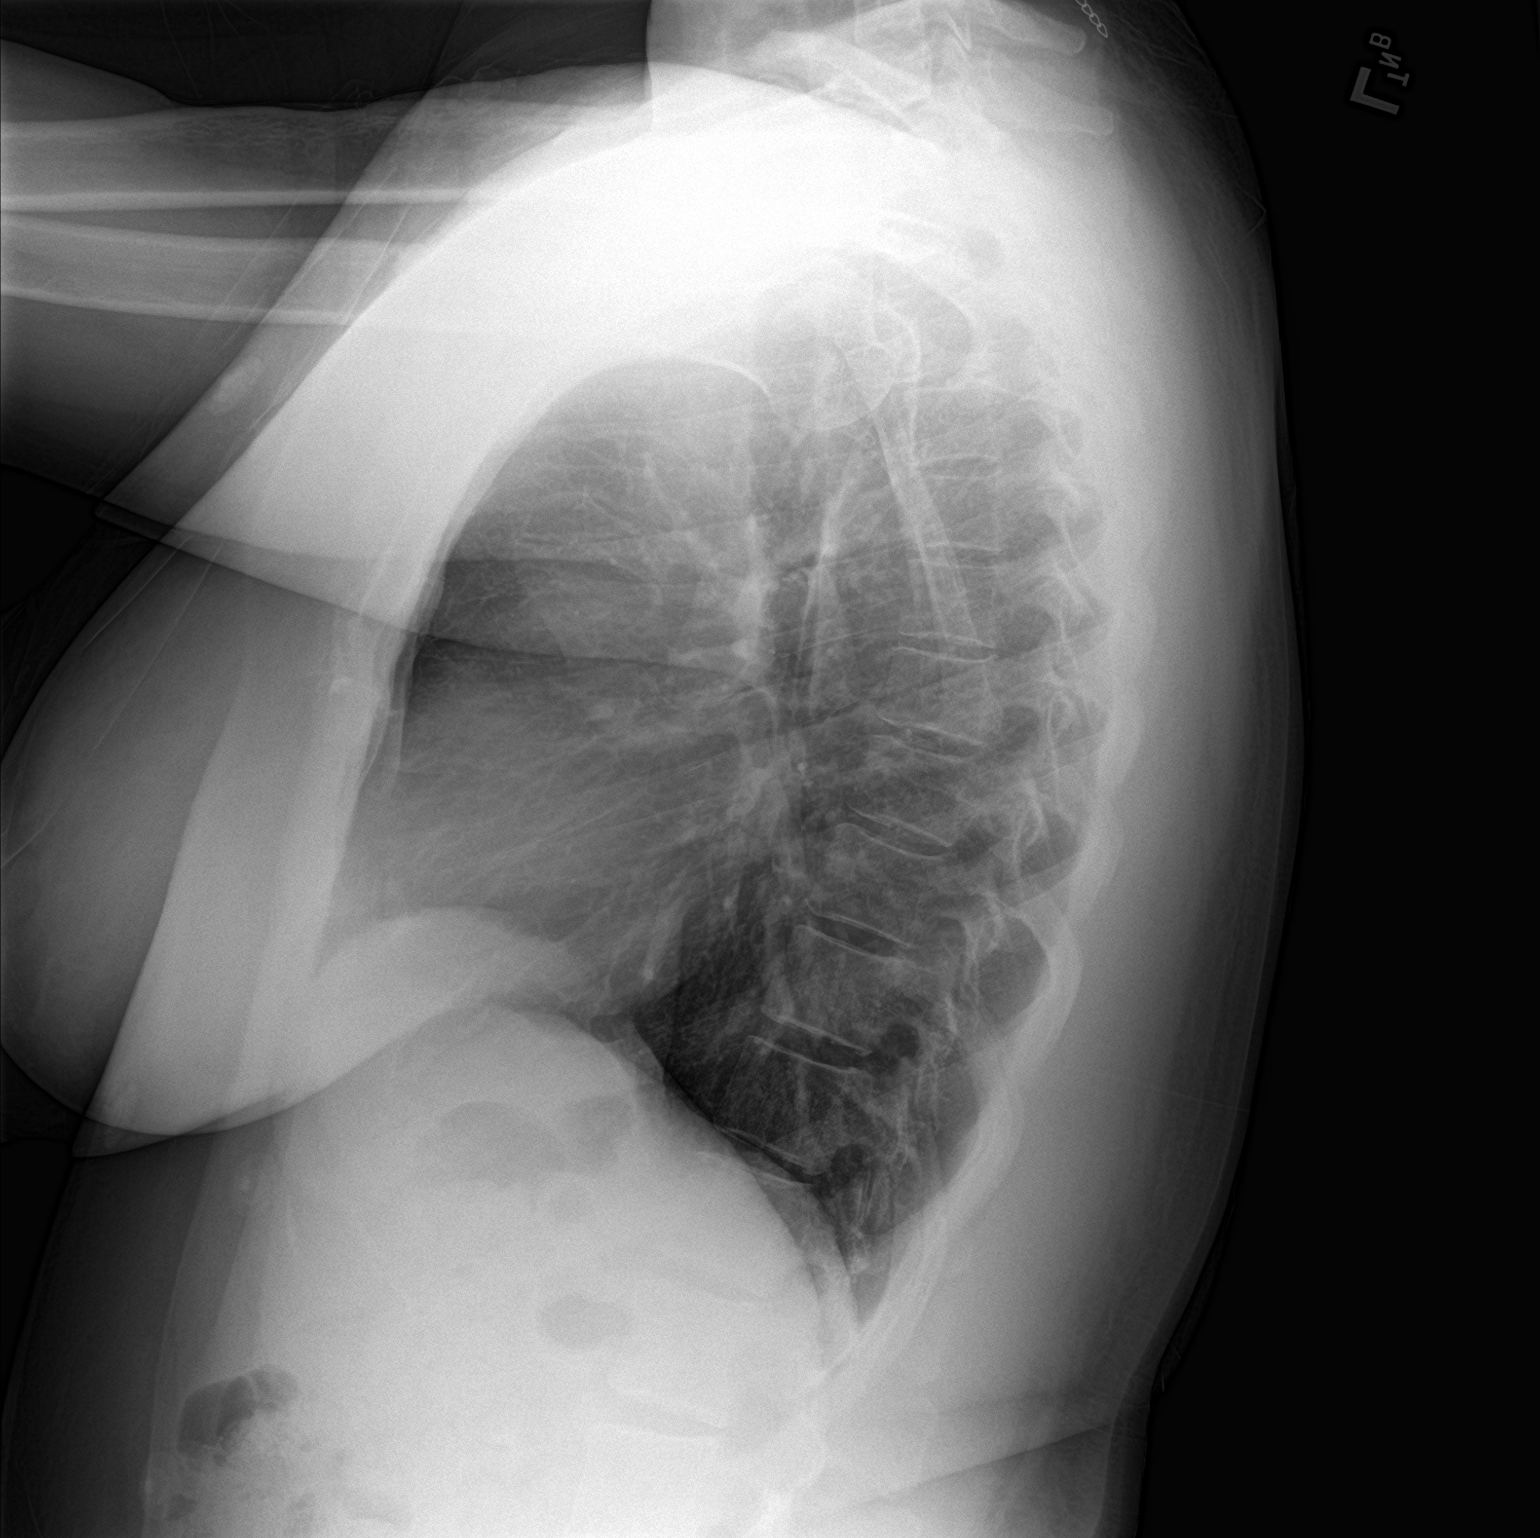

[2 of 2 positions shown; findings below may reference images not displayed]

FINDINGS: The heart size and mediastinal contours are normal. The lungs are
clear. There is no pleural effusion or pneumothorax. No acute
osseous findings are identified.
IMPRESSION: Stable chest.  No active cardiopulmonary process.
